# Patient Record
Sex: Male | Born: 1950
Health system: Southern US, Community
[De-identification: ages and names within clinical notes are randomized; demographics above are authoritative.]

## PROBLEM LIST (undated history)

## (undated) DIAGNOSIS — I10 Essential (primary) hypertension: Secondary | ICD-10-CM

## (undated) DIAGNOSIS — E119 Type 2 diabetes mellitus without complications: Secondary | ICD-10-CM

## (undated) DIAGNOSIS — M109 Gout, unspecified: Secondary | ICD-10-CM

## (undated) DIAGNOSIS — E785 Hyperlipidemia, unspecified: Secondary | ICD-10-CM

## (undated) DIAGNOSIS — C61 Malignant neoplasm of prostate: Secondary | ICD-10-CM

## (undated) HISTORY — PX: MULTIPLE TOOTH EXTRACTIONS: SHX2053

## (undated) HISTORY — PX: OTHER SURGICAL HISTORY: SHX169

## (undated) HISTORY — PX: PROSTATE BIOPSY: SHX241

---
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
## 2000-09-24 ENCOUNTER — Emergency Department (HOSPITAL_COMMUNITY): Admission: EM | Admit: 2000-09-24 | Discharge: 2000-09-24 | Payer: Self-pay | Admitting: Emergency Medicine

## 2001-02-21 ENCOUNTER — Emergency Department (HOSPITAL_COMMUNITY): Admission: EM | Admit: 2001-02-21 | Discharge: 2001-02-21 | Payer: Self-pay | Admitting: Emergency Medicine

## 2001-02-21 ENCOUNTER — Encounter: Payer: Self-pay | Admitting: Emergency Medicine

## 2001-12-05 ENCOUNTER — Emergency Department (HOSPITAL_COMMUNITY): Admission: EM | Admit: 2001-12-05 | Discharge: 2001-12-05 | Payer: Self-pay | Admitting: Emergency Medicine

## 2001-12-05 ENCOUNTER — Encounter: Payer: Self-pay | Admitting: Emergency Medicine

## 2004-09-18 ENCOUNTER — Emergency Department (HOSPITAL_COMMUNITY): Admission: EM | Admit: 2004-09-18 | Discharge: 2004-09-18 | Payer: Self-pay | Admitting: Emergency Medicine

## 2004-12-14 ENCOUNTER — Emergency Department (HOSPITAL_COMMUNITY): Admission: EM | Admit: 2004-12-14 | Discharge: 2004-12-14 | Payer: Self-pay | Admitting: Emergency Medicine

## 2005-06-16 ENCOUNTER — Emergency Department (HOSPITAL_COMMUNITY): Admission: EM | Admit: 2005-06-16 | Discharge: 2005-06-16 | Payer: Self-pay | Admitting: Emergency Medicine

## 2005-07-09 ENCOUNTER — Emergency Department (HOSPITAL_COMMUNITY): Admission: EM | Admit: 2005-07-09 | Discharge: 2005-07-09 | Payer: Self-pay | Admitting: Emergency Medicine

## 2006-07-26 ENCOUNTER — Emergency Department (HOSPITAL_COMMUNITY): Admission: EM | Admit: 2006-07-26 | Discharge: 2006-07-26 | Payer: Self-pay | Admitting: Emergency Medicine

## 2006-10-24 ENCOUNTER — Emergency Department (HOSPITAL_COMMUNITY): Admission: EM | Admit: 2006-10-24 | Discharge: 2006-10-24 | Payer: Self-pay | Admitting: Family Medicine

## 2008-11-02 ENCOUNTER — Emergency Department (HOSPITAL_COMMUNITY): Admission: EM | Admit: 2008-11-02 | Discharge: 2008-11-02 | Payer: Self-pay | Admitting: Emergency Medicine

## 2009-10-10 ENCOUNTER — Emergency Department (HOSPITAL_COMMUNITY): Admission: EM | Admit: 2009-10-10 | Discharge: 2009-10-10 | Payer: Self-pay | Admitting: Emergency Medicine

## 2009-10-20 ENCOUNTER — Emergency Department (HOSPITAL_COMMUNITY): Admission: EM | Admit: 2009-10-20 | Discharge: 2009-10-20 | Payer: Self-pay | Admitting: Emergency Medicine

## 2010-10-25 ENCOUNTER — Emergency Department (HOSPITAL_COMMUNITY)
Admission: EM | Admit: 2010-10-25 | Discharge: 2010-10-25 | Payer: Self-pay | Source: Home / Self Care | Admitting: Family Medicine

## 2015-04-26 ENCOUNTER — Emergency Department (HOSPITAL_COMMUNITY)
Admission: EM | Admit: 2015-04-26 | Discharge: 2015-04-26 | Disposition: A | Discharge status: Home / Self Care | Payer: BLUE CROSS/BLUE SHIELD | Attending: Emergency Medicine | Admitting: Emergency Medicine

## 2015-04-26 ENCOUNTER — Encounter (HOSPITAL_COMMUNITY): Payer: Self-pay | Admitting: Emergency Medicine

## 2015-04-26 DIAGNOSIS — I10 Essential (primary) hypertension: Secondary | ICD-10-CM | POA: Insufficient documentation

## 2015-04-26 DIAGNOSIS — M10061 Idiopathic gout, right knee: Secondary | ICD-10-CM | POA: Diagnosis not present

## 2015-04-26 DIAGNOSIS — M25561 Pain in right knee: Secondary | ICD-10-CM | POA: Diagnosis present

## 2015-04-26 DIAGNOSIS — M109 Gout, unspecified: Secondary | ICD-10-CM

## 2015-04-26 HISTORY — DX: Essential (primary) hypertension: I10

## 2015-04-26 HISTORY — DX: Gout, unspecified: M10.9

## 2015-04-26 MED ORDER — HYDROCODONE-ACETAMINOPHEN 5-325 MG PO TABS: 1.0000 | ORAL_TABLET | Freq: Four times a day (QID) | ORAL | Status: DC | PRN

## 2015-04-26 MED ORDER — INDOMETHACIN 25 MG PO CAPS: 25.0000 mg | ORAL_CAPSULE | Freq: Three times a day (TID) | ORAL | Status: DC | PRN

## 2015-04-26 NOTE — ED Provider Notes (Signed)
CSN: [id number]     Arrival date & time 04/26/15  4098 History   First MD Initiated Contact with Patient 04/26/15 1024     Chief Complaint  Patient presents with  . Knee Pain     (Consider location/radiation/quality/duration/timing/severity/associated sxs/prior Treatment) HPI Comments: Pt states that he has a history of gout and this feels similar. He does take allopurinol every day  Patient is a 64 y.o. male presenting with knee pain. The history is provided by the patient. No language interpreter was used.  Knee Pain Location:  Knee Injury: no   Knee location:  R knee Pain details:    Quality:  Aching   Radiates to:  Does not radiate   Severity:  Moderate   Onset quality:  Gradual   Timing:  Constant   Progression:  Unchanged Chronicity:  Recurrent Dislocation: no   Relieved by:  Nothing Worsened by:  Bearing weight Ineffective treatments:  None tried Associated symptoms: swelling   Associated symptoms: no decreased ROM and no muscle weakness     Past Medical History  Diagnosis Date  . Gout   . Hypertension    History reviewed. No pertinent past surgical history. Family History  Problem Relation Age of Onset  . Hypertension Mother   . Cancer Mother   . Heart failure Sister    History  Substance Use Topics  . Smoking status: Never Smoker   . Smokeless tobacco: Not on file  . Alcohol Use: Yes    Review of Systems  All other systems reviewed and are negative.     Allergies  Review of patient's allergies indicates not on file.  Home Medications   Prior to Admission medications   Medication Sig Start Date End Date Taking? Authorizing Provider  HYDROcodone-acetaminophen (NORCO/VICODIN) 5-325 MG per tablet Take 1-2 tablets by mouth every 6 (six) hours as needed for severe pain. 04/26/15   Glendell Docker, NP  indomethacin (INDOCIN) 25 MG capsule Take 1 capsule (25 mg total) by mouth 3 (three) times daily as needed. 04/26/15   Glendell Docker, NP   BP 161/95  mmHg  Pulse 74  Temp(Src) 98.7 F (37.1 C) (Oral)  Resp 18  Ht 5\' 7"  (1.702 m)  Wt 185 lb (83.915 kg)  BMI 28.97 kg/m2  SpO2 100% Physical Exam  Constitutional: He is oriented to person, place, and time. He appears well-developed and well-nourished.  Cardiovascular: Normal rate and regular rhythm.   Pulmonary/Chest: Effort normal and breath sounds normal.  Musculoskeletal:  Swelling and warmth noted to the right knee. Decrease rom. No redness noted  Neurological: He is alert and oriented to person, place, and time.  Skin: Skin is warm and dry.  Nursing note and vitals reviewed.   ED Course  Procedures (including critical care time) Labs Review Labs Reviewed - No data to display  Imaging Review No results found.   EKG Interpretation None      MDM   Final diagnoses:  Acute gout of right knee, unspecified cause    Considered septic joint although doubt. Pt given indomethacin and hydrocodone. Given return precautions   Glendell Docker, NP 04/26/15 [place], MD 04/26/15 [phone]

## 2015-04-26 NOTE — ED Notes (Addendum)
Pt reported having trouble bending rt knee. Pt denies injury/trauma. (+)PMS, CRT brisk and LROM. Noted swelling. No deformity/bruising noted. Pt reported having hx gout to lt knee.

## 2015-04-26 NOTE — Discharge Instructions (Signed)

## 2015-05-10 ENCOUNTER — Encounter (HOSPITAL_COMMUNITY): Payer: Self-pay | Admitting: *Deleted

## 2015-05-10 ENCOUNTER — Emergency Department (HOSPITAL_COMMUNITY)
Admission: EM | Admit: 2015-05-10 | Discharge: 2015-05-10 | Disposition: A | Discharge status: Home / Self Care | Payer: BLUE CROSS/BLUE SHIELD | Attending: Emergency Medicine | Admitting: Emergency Medicine

## 2015-05-10 DIAGNOSIS — I10 Essential (primary) hypertension: Secondary | ICD-10-CM | POA: Diagnosis not present

## 2015-05-10 DIAGNOSIS — M79641 Pain in right hand: Secondary | ICD-10-CM | POA: Diagnosis present

## 2015-05-10 DIAGNOSIS — M10031 Idiopathic gout, right wrist: Secondary | ICD-10-CM | POA: Diagnosis not present

## 2015-05-10 DIAGNOSIS — M109 Gout, unspecified: Secondary | ICD-10-CM

## 2015-05-10 MED ORDER — OXYCODONE-ACETAMINOPHEN 5-325 MG PO TABS
1.0000 | ORAL_TABLET | Freq: Once | ORAL | Status: AC
  Administered 2015-05-10: 1 via ORAL
  Filled 2015-05-10: qty 1

## 2015-05-10 MED ORDER — HYDROCODONE-ACETAMINOPHEN 5-325 MG PO TABS: 1.0000 | ORAL_TABLET | Freq: Four times a day (QID) | ORAL | Status: DC | PRN

## 2015-05-10 MED ORDER — INDOMETHACIN 25 MG PO CAPS: 25.0000 mg | ORAL_CAPSULE | Freq: Three times a day (TID) | ORAL | Status: DC | PRN

## 2015-05-10 NOTE — ED Notes (Signed)
Pt complains of pain and swelling in his right hand and wrist since 04/29/15. Pt denies injury. Pt states he has had gout in the past and had a similar problem in his right knee. Pt states ran out of gout medication.

## 2015-05-10 NOTE — Discharge Instructions (Signed)
Please follow-up with your PCP and inform them of your visit and all relevant information. Please monitor for new or worsening signs or symptoms, return immediately if new or worsening signs or symptoms present.

## 2015-05-10 NOTE — ED Provider Notes (Signed)
CSN: [id number]     Arrival date & time 05/10/15  1438 History  This chart was scribed for Lenn Sink, PA-C, working with Varney Biles, MD, by Starleen Arms, ED Scribe. This patient was seen in room WTR7/WTR7 and the patient's care was started at 3:25 PM.    Chief Complaint  Patient presents with  . Hand Pain   The history is provided by the patient. No language interpreter was used.   HPI Comments:  Jorge Decker is a 64 y.o. male with a history of gout who presents to the Emergency Department complaining of right hand pain and swelling onset 11 days ago without injury.  The pain is described as throbbing and can be worsened by the slightest touch. He reports a meat rich diet and increased etoh intake recently.  Patient reports a prior episode of gout in the right hand, but it typically manifests in his knees.  Patient recently began taking allopurinol daily after taking it intermittently for a period.  He has otherwise taken ibuprofen for pain.  Patient reports he recently had a gout flare in his right knee and was prescribed hydrocodone and  indocin with relief.  He denies history of STD.  He denies fever, nausea, vomiting, abdominal pain, penile discharge.     Past Medical History  Diagnosis Date  . Gout   . Hypertension    History reviewed. No pertinent past surgical history. Family History  Problem Relation Age of Onset  . Hypertension Mother   . Cancer Mother   . Heart failure Sister    History  Substance Use Topics  . Smoking status: Never Smoker   . Smokeless tobacco: Not on file  . Alcohol Use: Yes    Review of Systems  All other systems reviewed and are negative.   Allergies  Review of patient's allergies indicates not on file.  Home Medications   Prior to Admission medications   Medication Sig Start Date End Date Taking? Authorizing Provider  HYDROcodone-acetaminophen (NORCO/VICODIN) 5-325 MG per tablet Take 1 tablet by mouth every 6 (six) hours as needed.  05/10/15   Okey Regal, PA-C  indomethacin (INDOCIN) 25 MG capsule Take 1 capsule (25 mg total) by mouth 3 (three) times daily as needed. 05/10/15   Carlette Palmatier, PA-C   BP 142/95 mmHg  Pulse 79  Temp(Src) 98.6 F (37 C) (Oral)  Resp 18  SpO2 98%   Physical Exam  Constitutional: He is oriented to person, place, and time. He appears well-developed and well-nourished. No distress.  HENT:  Head: Normocephalic and atraumatic.  Eyes: Conjunctivae and EOM are normal.  Neck: Normal range of motion. Neck supple. No tracheal deviation present.  Cardiovascular: Normal rate.   Pulmonary/Chest: Effort normal. No respiratory distress.  Musculoskeletal: Normal range of motion.  Gouty tophi in the bilateral elbows.  Obvious swelling to the right wrist, warmth to touch.  No redness noted.  Distal sensation intact in affect extremity. Cap refill < 3 seconds in affected extremity.  Decreased ROM.   Neurological: He is alert and oriented to person, place, and time.  Skin: Skin is warm and dry.  Psychiatric: He has a normal mood and affect. His behavior is normal.  Nursing note and vitals reviewed.   ED Course  Procedures (including critical care time)  DIAGNOSTIC STUDIES: Oxygen Saturation is 98% on RA, normal by my interpretation.    COORDINATION OF CARE:  3:31 PM Discussed treatment plan with patient at bedside.  Patient acknowledges and agrees  with plan.    Labs Review Labs Reviewed - No data to display  Imaging Review No results found.   EKG Interpretation None      MDM   Final diagnoses:  Acute gout of right wrist, unspecified cause   . Labs:  Imaging:  Therapeutics:  Percocet  Assessment/Plan: Patient presents with likely gout to the right wrist. Wrist is warm,swollen, no signs of erythema. She has a history of gout has been eating more meats, drinking more alcohol recently, not taking his allopurinol as directed. Due to patient's significant past medical history and  similar presentation this is likely gout, highly unlikely to be septic arthritis at this time. Patient given a dose of narcotic pain medication here in the ED,prescribedindomethacin, Percocet for home. Patient isn't encouraged to follow up with his primary care provider in 3-5 days for reevaluation, he was given strict return precautions given worsening signs or symptoms present. He verbalizes understanding and agreement to today's plan and had no further questions concerns at time of discharge.  Discharge Meds: Indocin, Percocet   I personally performed the services described in this documentation, which was scribed in my presence. The recorded information has been reviewed and is accurate.   Okey Regal, PA-C 05/10/15 7062  Varney Biles, MD 05/11/15 [phone]

## 2016-06-08 ENCOUNTER — Encounter (HOSPITAL_COMMUNITY): Payer: Self-pay | Admitting: *Deleted

## 2016-06-08 ENCOUNTER — Inpatient Hospital Stay (HOSPITAL_COMMUNITY)
Admission: EM | Admit: 2016-06-08 | Discharge: 2016-06-10 | DRG: 638 | Disposition: A | Discharge status: Home Health | Payer: BLUE CROSS/BLUE SHIELD | Attending: Internal Medicine | Admitting: Internal Medicine

## 2016-06-08 DIAGNOSIS — Z8249 Family history of ischemic heart disease and other diseases of the circulatory system: Secondary | ICD-10-CM

## 2016-06-08 DIAGNOSIS — N179 Acute kidney failure, unspecified: Secondary | ICD-10-CM | POA: Diagnosis present

## 2016-06-08 DIAGNOSIS — Z79899 Other long term (current) drug therapy: Secondary | ICD-10-CM | POA: Diagnosis not present

## 2016-06-08 DIAGNOSIS — M109 Gout, unspecified: Secondary | ICD-10-CM | POA: Diagnosis present

## 2016-06-08 DIAGNOSIS — Z23 Encounter for immunization: Secondary | ICD-10-CM

## 2016-06-08 DIAGNOSIS — R748 Abnormal levels of other serum enzymes: Secondary | ICD-10-CM | POA: Diagnosis not present

## 2016-06-08 DIAGNOSIS — E1100 Type 2 diabetes mellitus with hyperosmolarity without nonketotic hyperglycemic-hyperosmolar coma (NKHHC): Secondary | ICD-10-CM | POA: Diagnosis not present

## 2016-06-08 DIAGNOSIS — I10 Essential (primary) hypertension: Secondary | ICD-10-CM | POA: Diagnosis present

## 2016-06-08 DIAGNOSIS — R7989 Other specified abnormal findings of blood chemistry: Secondary | ICD-10-CM | POA: Diagnosis present

## 2016-06-08 DIAGNOSIS — E1310 Other specified diabetes mellitus with ketoacidosis without coma: Secondary | ICD-10-CM | POA: Diagnosis not present

## 2016-06-08 DIAGNOSIS — E782 Mixed hyperlipidemia: Secondary | ICD-10-CM | POA: Diagnosis present

## 2016-06-08 DIAGNOSIS — E119 Type 2 diabetes mellitus without complications: Secondary | ICD-10-CM

## 2016-06-08 DIAGNOSIS — E1165 Type 2 diabetes mellitus with hyperglycemia: Secondary | ICD-10-CM | POA: Diagnosis present

## 2016-06-08 DIAGNOSIS — Z7984 Long term (current) use of oral hypoglycemic drugs: Secondary | ICD-10-CM | POA: Diagnosis not present

## 2016-06-08 DIAGNOSIS — E1110 Type 2 diabetes mellitus with ketoacidosis without coma: Secondary | ICD-10-CM

## 2016-06-08 DIAGNOSIS — E871 Hypo-osmolality and hyponatremia: Secondary | ICD-10-CM | POA: Diagnosis present

## 2016-06-08 DIAGNOSIS — E860 Dehydration: Secondary | ICD-10-CM | POA: Diagnosis present

## 2016-06-08 DIAGNOSIS — E876 Hypokalemia: Secondary | ICD-10-CM | POA: Diagnosis present

## 2016-06-08 LAB — URINALYSIS, ROUTINE W REFLEX MICROSCOPIC
Bilirubin Urine: NEGATIVE
Glucose, UA: >1000 mg/dL — AB
KETONES UR: 15 mg/dL — AB
Leukocytes, UA: NEGATIVE
NITRITE: NEGATIVE
Protein, ur: NEGATIVE mg/dL
SPECIFIC GRAVITY, URINE: 1.036 — AB (ref 1.005–1.030)
pH: 5.5 (ref 5.0–8.0)

## 2016-06-08 LAB — BASIC METABOLIC PANEL
Anion gap: 15 (ref 5–15)
BUN: 24 mg/dL — ABNORMAL HIGH (ref 6–20)
CALCIUM: 9.2 mg/dL (ref 8.9–10.3)
CO2: 22 mmol/L (ref 22–32)
Chloride: 95 mmol/L — ABNORMAL LOW (ref 101–111)
Creatinine, Ser: 1.37 mg/dL — ABNORMAL HIGH (ref 0.61–1.24)
GFR calc Af Amer: >60 mL/min (ref >60)
GFR, EST NON AFRICAN AMERICAN: 54 mL/min — AB (ref >60)
Glucose, Bld: 308 mg/dL — ABNORMAL HIGH (ref 65–99)
Potassium: 4.3 mmol/L (ref 3.5–5.1)
SODIUM: 132 mmol/L — AB (ref 135–145)

## 2016-06-08 LAB — CBG MONITORING, ED
GLUCOSE-CAPILLARY: 599 mg/dL — AB (ref 65–99)
GLUCOSE-CAPILLARY: 588 mg/dL — AB (ref 65–99)
GLUCOSE-CAPILLARY: 459 mg/dL — AB (ref 65–99)

## 2016-06-08 LAB — I-STAT ARTERIAL BLOOD GAS, ED
Acid-base deficit: 3 mmol/L — ABNORMAL HIGH (ref 0.0–2.0)
Bicarbonate: 22.4 mEq/L (ref 20.0–24.0)
O2 Saturation: 95 %
PCO2 ART: 39.5 mmHg (ref 35.0–45.0)
PH ART: 7.362 (ref 7.350–7.450)
TCO2: 24 mmol/L (ref 0–100)
pO2, Arterial: 79 mmHg — ABNORMAL LOW (ref 80.0–100.0)

## 2016-06-08 LAB — PHOSPHORUS: PHOSPHORUS: 1.8 mg/dL — AB (ref 2.5–4.6)

## 2016-06-08 LAB — GLUCOSE, CAPILLARY
Glucose-Capillary: 322 mg/dL — ABNORMAL HIGH (ref 65–99)
Glucose-Capillary: 281 mg/dL — ABNORMAL HIGH (ref 65–99)

## 2016-06-08 LAB — CBC
HCT: 36.4 % — ABNORMAL LOW (ref 39.0–52.0)
HEMOGLOBIN: 12.9 g/dL — AB (ref 13.0–17.0)
MCH: 32.7 pg (ref 26.0–34.0)
MCHC: 35.4 g/dL (ref 30.0–36.0)
MCV: 92.4 fL (ref 78.0–100.0)
PLATELETS: 146 10*3/uL — AB (ref 150–400)
RBC: 3.94 MIL/uL — ABNORMAL LOW (ref 4.22–5.81)
RDW: 11.9 % (ref 11.5–15.5)
WBC: 7.2 10*3/uL (ref 4.0–10.5)

## 2016-06-08 LAB — URINE MICROSCOPIC-ADD ON: Bacteria, UA: NONE SEEN

## 2016-06-08 LAB — BETA-HYDROXYBUTYRIC ACID: BETA-HYDROXYBUTYRIC ACID: 3.18 mmol/L — AB (ref 0.05–0.27)

## 2016-06-08 LAB — MAGNESIUM: Magnesium: 2.2 mg/dL (ref 1.7–2.4)

## 2016-06-08 MED ORDER — SODIUM CHLORIDE 0.9 % IV BOLUS (SEPSIS)
1000.0000 mL | Freq: Once | INTRAVENOUS | Status: AC
  Administered 2016-06-08: 1000 mL via INTRAVENOUS

## 2016-06-08 MED ORDER — SODIUM CHLORIDE 0.9 % IV SOLN
INTRAVENOUS | Status: DC
  Administered 2016-06-09: 08:00:00 via INTRAVENOUS

## 2016-06-08 MED ORDER — THIAMINE HCL 100 MG/ML IJ SOLN: 100.0000 mg | Freq: Every day | INTRAMUSCULAR | Status: DC

## 2016-06-08 MED ORDER — ASPIRIN EC 81 MG PO TBEC
81.0000 mg | DELAYED_RELEASE_TABLET | Freq: Every day | ORAL | Status: DC
  Administered 2016-06-09 – 2016-06-10 (×2): 81 mg via ORAL
  Filled 2016-06-08 (×2): qty 1

## 2016-06-08 MED ORDER — VITAMIN B-1 100 MG PO TABS
100.0000 mg | ORAL_TABLET | Freq: Every day | ORAL | Status: DC
  Administered 2016-06-09 – 2016-06-10 (×2): 100 mg via ORAL
  Filled 2016-06-08 (×2): qty 1

## 2016-06-08 MED ORDER — ATORVASTATIN CALCIUM 40 MG PO TABS
40.0000 mg | ORAL_TABLET | Freq: Every day | ORAL | Status: DC
  Administered 2016-06-09: 40 mg via ORAL
  Filled 2016-06-08: qty 1

## 2016-06-08 MED ORDER — ONDANSETRON HCL 4 MG PO TABS: 4.0000 mg | ORAL_TABLET | Freq: Four times a day (QID) | ORAL | Status: DC | PRN

## 2016-06-08 MED ORDER — FOLIC ACID 1 MG PO TABS
1.0000 mg | ORAL_TABLET | Freq: Every day | ORAL | Status: DC
  Administered 2016-06-09 – 2016-06-10 (×2): 1 mg via ORAL
  Filled 2016-06-08 (×2): qty 1

## 2016-06-08 MED ORDER — ACETAMINOPHEN 650 MG RE SUPP: 650.0000 mg | Freq: Four times a day (QID) | RECTAL | Status: DC | PRN

## 2016-06-08 MED ORDER — ACETAMINOPHEN 325 MG PO TABS: 650.0000 mg | ORAL_TABLET | Freq: Four times a day (QID) | ORAL | Status: DC | PRN

## 2016-06-08 MED ORDER — SODIUM CHLORIDE 0.9 % IV SOLN
INTRAVENOUS | Status: DC
  Administered 2016-06-08: 1000 mL via INTRAVENOUS

## 2016-06-08 MED ORDER — ENOXAPARIN SODIUM 30 MG/0.3ML ~~LOC~~ SOLN
30.0000 mg | SUBCUTANEOUS | Status: DC
  Administered 2016-06-09: 30 mg via SUBCUTANEOUS
  Filled 2016-06-08: qty 0.3

## 2016-06-08 MED ORDER — SODIUM CHLORIDE 0.9 % IV SOLN
INTRAVENOUS | Status: DC
  Administered 2016-06-08: 4 [IU]/h via INTRAVENOUS
  Filled 2016-06-08: qty 2.5

## 2016-06-08 MED ORDER — LORAZEPAM 1 MG PO TABS
1.0000 mg | ORAL_TABLET | Freq: Four times a day (QID) | ORAL | Status: DC | PRN
  Administered 2016-06-09: 1 mg via ORAL
  Filled 2016-06-08: qty 1

## 2016-06-08 MED ORDER — INSULIN REGULAR HUMAN 100 UNIT/ML IJ SOLN
INTRAMUSCULAR | Status: DC
  Administered 2016-06-08: 4.4 [IU]/h via INTRAVENOUS
  Administered 2016-06-08: 2.6 [IU]/h via INTRAVENOUS
  Administered 2016-06-09: 1 [IU]/h via INTRAVENOUS
  Administered 2016-06-09: 1.5 [IU]/h via INTRAVENOUS
  Administered 2016-06-09: 4.7 [IU]/h via INTRAVENOUS
  Administered 2016-06-09: 3.8 [IU]/h via INTRAVENOUS
  Administered 2016-06-09: 3.3 [IU]/h via INTRAVENOUS
  Administered 2016-06-09: 0.8 [IU]/h via INTRAVENOUS
  Administered 2016-06-09: 2.9 [IU]/h via INTRAVENOUS
  Filled 2016-06-08: qty 2.5

## 2016-06-08 MED ORDER — ADULT MULTIVITAMIN W/MINERALS CH
1.0000 | ORAL_TABLET | Freq: Every day | ORAL | Status: DC
  Administered 2016-06-09 – 2016-06-10 (×2): 1 via ORAL
  Filled 2016-06-08 (×2): qty 1

## 2016-06-08 MED ORDER — DEXTROSE-NACL 5-0.45 % IV SOLN
INTRAVENOUS | Status: DC
  Administered 2016-06-09: 125 mL/h via INTRAVENOUS

## 2016-06-08 MED ORDER — POTASSIUM CHLORIDE 10 MEQ/100ML IV SOLN
10.0000 meq | INTRAVENOUS | Status: AC
  Administered 2016-06-09 (×2): 10 meq via INTRAVENOUS
  Filled 2016-06-08 (×2): qty 100

## 2016-06-08 MED ORDER — DEXTROSE-NACL 5-0.45 % IV SOLN: INTRAVENOUS | Status: DC

## 2016-06-08 MED ORDER — ONDANSETRON HCL 4 MG/2ML IJ SOLN: 4.0000 mg | Freq: Four times a day (QID) | INTRAMUSCULAR | Status: DC | PRN

## 2016-06-08 MED ORDER — LORAZEPAM 2 MG/ML IJ SOLN: 1.0000 mg | Freq: Four times a day (QID) | INTRAMUSCULAR | Status: DC | PRN

## 2016-06-08 NOTE — ED Notes (Signed)
Attempted to call report

## 2016-06-08 NOTE — ED Triage Notes (Signed)
Pt went to PCP, had blood work done, glu showed 690, anion gap 21.5, A1C 14.6, Na 121. Pt reports increase thirst, urination. Labs drawn before 0900. Pt has not taken any insulin today.

## 2016-06-08 NOTE — H&P (Signed)
History and Physical  Patient Name: Jorge Decker     [id number]    DOB: Apr 21, 1951    DOA: 06/08/2016 PCP: Henrine Screws, MD   Patient coming from: Home  Chief Complaint: Polyuria and blurry vision  HPI: Jorge Decker is a 65 y.o. male with a past medical history significant for HTN and gout who presents with new onset diabetes.  The patient works at the [employer] where it has been extremely hot lately, and he has been drinking "lots" of sugary drinks to stay hydrated and eating "lots" of snacks to keep his energy up.  Over the last few weeks then, he has had polydipsia, polyuria, blurry vision and hunger.  Today he was seen at his PCP's office, diagnosed with new onset diabetes, and started on metformin.  A BMP was drawn at that visit, which returned in the afternoon showing AG >20 and glucose >600 mg/dL (and also HgbA1c 14%) and so he was called and told to go to the ER.  ED course: -Afebrile, heart rate 97, respirations, SpO2 and blood pressure normal  -Na 122 (only corrects to 130 with glucose), K 4.7, Cr 1.83 (baseline 0.9), WBC 7.2K, Hgb 12.9 -AG 19, bicarb was 19, but pH 7.36 -He was given 1L NS and TRH were asked to evaluate for admission  He has no family members with type 1 or 2 diabetes, and no family members with autoimmune disease that he knows of.  His BMI is ~26.  He has never been diagnosed with diabetes before and at his last physical at [facility] in October, his fasting glucose was 97 mg/dL.      ROS: Review of Systems  Constitutional: Negative for chills, diaphoresis, fever, malaise/fatigue and weight loss.  HENT: Negative.   Eyes: Positive for blurred vision.       Blurry vision, off an on for months  Respiratory: Negative for cough, sputum production, shortness of breath and wheezing.   Cardiovascular: Negative.   Gastrointestinal: Negative for abdominal pain, nausea and vomiting.  Genitourinary: Negative for dysuria, flank pain, frequency,  hematuria and urgency.  Musculoskeletal: Negative.   Skin: Negative.   Neurological: Negative.  Negative for weakness.  Endo/Heme/Allergies: Positive for polydipsia.  Psychiatric/Behavioral: Negative.       All other systems negative except as just noted or noted in the history of present illness.    Past Medical History:  Diagnosis Date  . Gout   . Hypertension     History reviewed. No pertinent surgical history.  Social History: Patient lives with his nephew.  The patient walks unassisted.  He works at the [employer].  He does not smoke.  He drinks about three beers plus a few shots of liquor daily, "a lot more" on weekends.  No history of withdrawal.  No illicit drug use.  No Known Allergies  Family history: family history includes Cancer in his mother; Heart failure in his sister; Hypertension in his mother.  Prior to Admission medications   Medication Sig Start Date End Date Taking? Authorizing Provider  HYDROcodone-acetaminophen (NORCO/VICODIN) 5-325 MG per tablet Take 1 tablet by mouth every 6 (six) hours as needed. 05/10/15   Okey Regal, PA-C  indomethacin (INDOCIN) 25 MG capsule Take 1 capsule (25 mg total) by mouth 3 (three) times daily as needed. 05/10/15   Okey Regal, PA-C       Physical Exam: BP 125/87   Pulse 93   Temp 98 F (36.7 C) (Oral)   Resp  20   Ht 5\' 6"  (1.676 m)   Wt 75.8 kg (167 lb 3.2 oz)   SpO2 94%   BMI 26.99 kg/m  General appearance: Well-developed, adult male, alert and in no acute distress.  Conversational.   Eyes: Anicteric, conjunctiva pink, lids and lashes normal.     ENT: No nasal deformity, discharge, or epistaxis.  OP moist without lesions.  Dentures in place on top, bridge on lower.  No thyromegaly. Lymph: No cervical or supraclavicular lymphadenopathy. Skin: Warm and dry.  No jaundice.  No suspicious rashes or lesions.  No acanthosis. Cardiac: Tachycardic, nl S1-S2, no murmurs appreciated.  Capillary refill is brisk.   JVP normal.  No LE edema.  Radial and DP pulses 2+ and symmetric. Respiratory: Normal respiratory rate and rhythm.  CTAB without rales or wheezes. GI: Abdomen soft without rigidity.  No TTP. No ascites, distension, hepatosplenomegaly.   MSK: No deformities or effusions.  No clubbing/cyanosis. Neuro: Cranial nerves normal. Sensorium intact and responding to questions, attention normal.  Speech is fluent.  Moves all extremities equally and with normal coordination.    Psych: Affect normal.  Judgment and insight appear normal.       Labs on Admission:  I have personally reviewed following labs and imaging studies: CBC:  Recent Labs Lab 06/08/16 1650  WBC 7.2  HGB 12.9*  HCT 36.4*  MCV 92.4  PLT 123456*   Basic Metabolic Panel:  Recent Labs Lab 06/08/16 1650  NA 122*  K 4.7  CL 84*  CO2 19*  GLUCOSE 592*  BUN 30*  CREATININE 1.83*  CALCIUM 9.6   GFR: Estimated Creatinine Clearance: 37.8 mL/min (by C-G formula based on SCr of 1.83 mg/dL).   HbA1C: 14% at clinic today  CBG:  Recent Labs Lab 06/08/16 1646 06/08/16 1831  GLUCAP 599* 588*       Assessment/Plan 1. New onset diabetes, unclear type, with hyperglycemia:  Not acidotic, but close, and with elevated anion gap.  Suspect a T1DM or LADA.  He follows with a PCP and his fasting sugar 10 months ago was normal, which is hard to rectify with type 2 diabetes, although this is possible.   -Insulin drip per protocol overnight -NPO -IVF -q4hrs BMP for now -Check BHOB -Supplement K -Check mag and phos -Will check GAD65/islet cell antibodies, for guidance on whether to start insulin at discharge -Consult to nutrtiion and diabetes education -Start baby aspirin and statin in diabetic -Check TSH and fasting lipids   2. HTN:  -Continue metoprolol -Previously on amlodipine-olmesartan, will restart ARB if BP remains elevated  3. Elevated creatinine:  Likely from polyuria. -Fluid resuscitation and trend BMP  4.  Hyponatremia:  Despite correction, still low. -Trend overnight      DVT prophylaxis: Lovenox  Code Status: FULL  Family Communication: None present  Disposition Plan: Anticipate IV insulin to correct glucose and close monitoring of electrolytes and renal function, consultation with diabetes education and nutrition.  Consults called: None Admission status: INPATIENT, telemetry     Medical decision making: Patient seen at 8:10 PM on 06/08/2016.  The patient was discussed with Will Dansie, PA-C. What exists of the patient's chart was reviewed in depth and outside records from his PCP's office were reviewed.  Clinical condition: stable but requiring repeat lab monitoring and hourly glucose checks overnight, with multiple laboratory abnormalities.          Edwin Dada [facility] Pager [phone]    At the time of admission, it appears  that the appropriate admission status for this patient is INPATIENT. This is judged to be reasonable and necessary in order to provide the required intensity of service to ensure the patient's safety given the presenting symptoms, physical exam findings, and initial radiographic and laboratory data in the context of their chronic comorbidities.  Together, these circumstances are felt to place her/him at high risk for further clinical deterioration threatening life, limb, or organ. The following factors support the admission status of inpatient:   A. The patient's presenting symptoms include polyuria, polydipsia, blurry vision B. The worrisome physical exam findings include tachycardia C. The initial radiographic and laboratory data are worrisome because of elevated creatinine, hyponatremia, hyperglycemia, low bicarbonate, elevated anion gap D. The chronic co-morbidities include hypertension E. Patient requires inpatient status due to high intensity of service, high risk for further deterioration and high frequency of surveillance  required. F. I certify that at the point of admission it is my clinical judgment that the patient will require inpatient hospital care spanning beyond 2 midnights from the point of admission.

## 2016-06-08 NOTE — ED Notes (Signed)
CBG: 588 RN notified

## 2016-06-08 NOTE — ED Notes (Addendum)
Critical Glucose 592 received from Lab. Primary RN, [place] notified.

## 2016-06-08 NOTE — ED Provider Notes (Signed)
[facility] DEPT Provider Note   CSN: [id number] Arrival date & time: 06/08/16  1623     History   Chief Complaint Chief Complaint  Patient presents with  . Hyperglycemia    HPI Jorge Decker is a 65 y.o. male.  Jorge Decker is a 65 y.o. Male who presents to the emergency department from his primary care doctor's office with new onset diabetes. The patient reports over the past several weeks he's had lots of increased thirst, hunger and increased urination. He reports he had blood work done at his primary care doctor's office today and was noted to have elevated blood sugars. His blood work showed a glucose of 690, and an anion gap of 21.5. He was directed to the emergency department. Patient has no history of diabetes. He denies any family history diabetes. He reports he only takes medication for hypertension. Patient denies fevers, chest pain, shortness of breath, abdominal pain, nausea, vomiting, rashes or dysuria.   The history is provided by the patient. No language interpreter was used.    Past Medical History:  Diagnosis Date  . Gout   . Hypertension     Patient Active Problem List   Diagnosis Date Noted  . Diabetes mellitus, new onset ([place]) 06/08/2016  . Essential hypertension 06/08/2016  . Elevated serum creatinine 06/08/2016  . Diabetes mellitus with hyperosmolarity without hyperglycemic hyperosmolar nonketotic coma ([place]) 06/08/2016    History reviewed. No pertinent surgical history.     Home Medications    Prior to Admission medications   Medication Sig Start Date End Date Taking? Authorizing Provider  allopurinol (ZYLOPRIM) 300 MG tablet Take 300 mg by mouth every morning. 05/21/16  Yes Historical Provider, MD  Cholecalciferol (VITAMIN D3) 1000 units CAPS Take 2,000 Units by mouth daily.   Yes Historical Provider, MD  metFORMIN (GLUCOPHAGE-XR) 500 MG 24 hr tablet Take 500 mg by mouth daily with breakfast.   Yes Historical Provider, MD  metoprolol  (LOPRESSOR) 50 MG tablet Take 50 mg by mouth 2 (two) times daily. 06/03/16  Yes Historical Provider, MD  naproxen sodium (ALEVE) 220 MG tablet Take 220-440 mg by mouth 2 (two) times daily as needed (for pain).   Yes Historical Provider, MD  probenecid (BENEMID) 500 MG tablet Take 500 mg by mouth daily.   Yes Historical Provider, MD  vitamin B-12 (CYANOCOBALAMIN) 500 MCG tablet Take 500 mcg by mouth daily.   Yes Historical Provider, MD  HYDROcodone-acetaminophen (NORCO/VICODIN) 5-325 MG per tablet Take 1 tablet by mouth every 6 (six) hours as needed. Patient not taking: Reported on 06/08/2016 05/10/15   Okey Regal, PA-C  indomethacin (INDOCIN) 25 MG capsule Take 1 capsule (25 mg total) by mouth 3 (three) times daily as needed. Patient not taking: Reported on 06/08/2016 05/10/15   Okey Regal, PA-C    Family History Family History  Problem Relation Age of Onset  . Hypertension Mother   . Cancer Mother   . Heart failure Sister   . Diabetes Neg Hx   . Autoimmune disease Neg Hx     Social History Social History  Substance Use Topics  . Smoking status: Never Smoker  . Smokeless tobacco: Never Used  . Alcohol use Yes     Comment: 5 per day     Allergies   Review of patient's allergies indicates no known allergies.   Review of Systems Review of Systems  Constitutional: Negative for chills and fever.  HENT: Negative for congestion and sore throat.   Eyes:  Negative for visual disturbance.  Respiratory: Negative for cough and shortness of breath.   Cardiovascular: Negative for chest pain.  Gastrointestinal: Negative for abdominal pain, nausea and vomiting.  Endocrine: Positive for polydipsia, polyphagia and polyuria.  Genitourinary: Positive for frequency. Negative for decreased urine volume, dysuria, hematuria and urgency.  Musculoskeletal: Negative for back pain and neck pain.  Skin: Negative for rash and wound.  Neurological: Negative for headaches.     Physical  Exam Updated Vital Signs BP 125/87   Pulse 93   Temp 98 F (36.7 C) (Oral)   Resp 20   Ht 5\' 6"  (1.676 m)   Wt 75.8 kg   SpO2 94%   BMI 26.99 kg/m   Physical Exam  Constitutional: He appears well-developed and well-nourished. No distress.  Nontoxic appearing.  HENT:  Head: Normocephalic and atraumatic.  Mouth/Throat: Oropharynx is clear and moist.  Mucous membranes are moist.  Eyes: Conjunctivae are normal. Pupils are equal, round, and reactive to light. Right eye exhibits no discharge. Left eye exhibits no discharge.  Neck: Neck supple. No JVD present.  Cardiovascular: Normal rate, regular rhythm, normal heart sounds and intact distal pulses.  Exam reveals no gallop and no friction rub.   No murmur heard. Pulmonary/Chest: Effort normal and breath sounds normal. No respiratory distress. He has no wheezes. He has no rales.  Abdominal: Soft. There is no tenderness. There is no guarding.  Musculoskeletal: He exhibits no edema.  Lymphadenopathy:    He has no cervical adenopathy.  Neurological: He is alert. Coordination normal.  Skin: Skin is warm and dry. Capillary refill takes less than 2 seconds. No rash noted. He is not diaphoretic. No erythema. No pallor.  Psychiatric: He has a normal mood and affect. His behavior is normal.  Nursing note and vitals reviewed.    ED Treatments / Results  Labs (all labs ordered are listed, but only abnormal results are displayed) Labs Reviewed  BASIC METABOLIC PANEL - Abnormal; Notable for the following:       Result Value   Sodium 122 (*)    Chloride 84 (*)    CO2 19 (*)    Glucose, Bld 592 (*)    BUN 30 (*)    Creatinine, Ser 1.83 (*)    GFR calc non Af Amer 38 (*)    GFR calc Af Amer 44 (*)    Anion gap 19 (*)    All other components within normal limits  CBC - Abnormal; Notable for the following:    RBC 3.94 (*)    Hemoglobin 12.9 (*)    HCT 36.4 (*)    Platelets 146 (*)    All other components within normal limits   URINALYSIS, ROUTINE W REFLEX MICROSCOPIC (NOT AT [facility]) - Abnormal; Notable for the following:    Specific Gravity, Urine 1.036 (*)    Glucose, UA >1000 (*)    Hgb urine dipstick TRACE (*)    Ketones, ur 15 (*)    All other components within normal limits  URINE MICROSCOPIC-ADD ON - Abnormal; Notable for the following:    Squamous Epithelial / LPF 0-5 (*)    All other components within normal limits  CBG MONITORING, ED - Abnormal; Notable for the following:    Glucose-Capillary 599 (*)    All other components within normal limits  CBG MONITORING, ED - Abnormal; Notable for the following:    Glucose-Capillary 588 (*)    All other components within normal limits  CBG MONITORING, ED -  Abnormal; Notable for the following:    Glucose-Capillary 459 (*)    All other components within normal limits  I-STAT ARTERIAL BLOOD GAS, ED - Abnormal; Notable for the following:    pO2, Arterial 79.0 (*)    Acid-base deficit 3.0 (*)    All other components within normal limits  BLOOD GAS, ARTERIAL  CBG MONITORING, ED    EKG  EKG Interpretation None       Radiology No results found.  Procedures Procedures (including critical care time)  Medications Ordered in ED Medications  dextrose 5 %-0.45 % sodium chloride infusion (not administered)  insulin regular (NOVOLIN R,HUMULIN R) 250 Units in sodium chloride 0.9 % 250 mL (1 Units/mL) infusion (not administered)  sodium chloride 0.9 % bolus 1,000 mL (1,000 mLs Intravenous New Bag/Given 06/08/16 2025)    And  0.9 %  sodium chloride infusion (not administered)  sodium chloride 0.9 % bolus 1,000 mL (0 mLs Intravenous Stopped 06/08/16 2012)     Initial Impression / Assessment and Plan / ED Course  I have reviewed the triage vital signs and the nursing notes.  Pertinent labs & imaging results that were available during my care of the patient were reviewed by me and considered in my medical decision making (see chart for details).  Clinical  Course   Patient presents with new onset diabetes. No history of diabetes or DKA. Patient presents with glucose of 592. Creatinine of 1.83. Anion gap of 19. Patient started on glucose stabilizer. Will admit for new onset diabetes and DKA. Patient in agreement with admission. Patient accepted for admission by Dr. Loleta Books.   Final Clinical Impressions(s) / ED Diagnoses   Final diagnoses:  Diabetic ketoacidosis without coma associated with type 2 diabetes mellitus [facility])    New Prescriptions New Prescriptions   No medications on file     Waynetta Pean, PA-C 06/08/16 2047    Charlesetta Shanks, MD 06/09/16 [phone]

## 2016-06-08 NOTE — ED Notes (Signed)
Next CBG due at 2145

## 2016-06-09 ENCOUNTER — Encounter (HOSPITAL_COMMUNITY): Payer: Self-pay | Admitting: *Deleted

## 2016-06-09 DIAGNOSIS — E876 Hypokalemia: Secondary | ICD-10-CM | POA: Diagnosis present

## 2016-06-09 DIAGNOSIS — I10 Essential (primary) hypertension: Secondary | ICD-10-CM

## 2016-06-09 DIAGNOSIS — N179 Acute kidney failure, unspecified: Secondary | ICD-10-CM | POA: Diagnosis present

## 2016-06-09 DIAGNOSIS — R748 Abnormal levels of other serum enzymes: Secondary | ICD-10-CM

## 2016-06-09 DIAGNOSIS — E119 Type 2 diabetes mellitus without complications: Secondary | ICD-10-CM

## 2016-06-09 DIAGNOSIS — E1100 Type 2 diabetes mellitus with hyperosmolarity without nonketotic hyperglycemic-hyperosmolar coma (NKHHC): Principal | ICD-10-CM

## 2016-06-09 LAB — GLUCOSE, CAPILLARY
GLUCOSE-CAPILLARY: 134 mg/dL — AB (ref 65–99)
GLUCOSE-CAPILLARY: 137 mg/dL — AB (ref 65–99)
GLUCOSE-CAPILLARY: 157 mg/dL — AB (ref 65–99)
GLUCOSE-CAPILLARY: 207 mg/dL — AB (ref 65–99)
GLUCOSE-CAPILLARY: 209 mg/dL — AB (ref 65–99)
Glucose-Capillary: 163 mg/dL — ABNORMAL HIGH (ref 65–99)
Glucose-Capillary: 170 mg/dL — ABNORMAL HIGH (ref 65–99)
Glucose-Capillary: 179 mg/dL — ABNORMAL HIGH (ref 65–99)
Glucose-Capillary: 186 mg/dL — ABNORMAL HIGH (ref 65–99)
Glucose-Capillary: 193 mg/dL — ABNORMAL HIGH (ref 65–99)
Glucose-Capillary: 216 mg/dL — ABNORMAL HIGH (ref 65–99)

## 2016-06-09 LAB — BASIC METABOLIC PANEL
ANION GAP: 19 — AB (ref 5–15)
ANION GAP: 6 (ref 5–15)
ANION GAP: 4 — AB (ref 5–15)
BUN: 30 mg/dL — ABNORMAL HIGH (ref 6–20)
BUN: 17 mg/dL (ref 6–20)
BUN: 15 mg/dL (ref 6–20)
CALCIUM: 8 mg/dL — AB (ref 8.9–10.3)
CALCIUM: 8 mg/dL — AB (ref 8.9–10.3)
CO2: 19 mmol/L — ABNORMAL LOW (ref 22–32)
CO2: 26 mmol/L (ref 22–32)
CO2: 25 mmol/L (ref 22–32)
CREATININE: 1.83 mg/dL — AB (ref 0.61–1.24)
Calcium: 9.6 mg/dL (ref 8.9–10.3)
Chloride: 84 mmol/L — ABNORMAL LOW (ref 101–111)
Chloride: 104 mmol/L (ref 101–111)
Chloride: 105 mmol/L (ref 101–111)
Creatinine, Ser: 0.95 mg/dL (ref 0.61–1.24)
Creatinine, Ser: 0.93 mg/dL (ref 0.61–1.24)
GFR calc Af Amer: 44 mL/min — ABNORMAL LOW (ref >60)
GFR, EST NON AFRICAN AMERICAN: 38 mL/min — AB (ref >60)
GLUCOSE: 137 mg/dL — AB (ref 65–99)
GLUCOSE: 186 mg/dL — AB (ref 65–99)
Glucose, Bld: 592 mg/dL (ref 65–99)
POTASSIUM: 4.1 mmol/L (ref 3.5–5.1)
POTASSIUM: 3.4 mmol/L — AB (ref 3.5–5.1)
Potassium: 4.7 mmol/L (ref 3.5–5.1)
Sodium: 122 mmol/L — ABNORMAL LOW (ref 135–145)
Sodium: 136 mmol/L (ref 135–145)
Sodium: 134 mmol/L — ABNORMAL LOW (ref 135–145)

## 2016-06-09 LAB — LIPID PANEL
CHOLESTEROL: 308 mg/dL — AB (ref 0–200)
LDL CALC: UNDETERMINED mg/dL (ref 0–99)
TRIGLYCERIDES: 1422 mg/dL — AB (ref <150)
VLDL: UNDETERMINED mg/dL (ref 0–40)

## 2016-06-09 LAB — TRIGLYCERIDES: Triglycerides: 1234 mg/dL — ABNORMAL HIGH (ref <150)

## 2016-06-09 LAB — TSH: TSH: 1.396 u[IU]/mL (ref 0.350–4.500)

## 2016-06-09 MED ORDER — INSULIN ASPART 100 UNIT/ML ~~LOC~~ SOLN
3.0000 [IU] | Freq: Three times a day (TID) | SUBCUTANEOUS | Status: DC
  Administered 2016-06-09 – 2016-06-10 (×5): 3 [IU] via SUBCUTANEOUS

## 2016-06-09 MED ORDER — PNEUMOCOCCAL VAC POLYVALENT 25 MCG/0.5ML IJ INJ
0.5000 mL | INJECTION | INTRAMUSCULAR | Status: AC
Start: 2016-06-10 — End: 2016-06-10
  Administered 2016-06-10: 0.5 mL via INTRAMUSCULAR
  Filled 2016-06-09: qty 0.5

## 2016-06-09 MED ORDER — LIVING WELL WITH DIABETES BOOK
Freq: Once | Status: AC
  Administered 2016-06-09: 17:00:00
  Filled 2016-06-09: qty 1

## 2016-06-09 MED ORDER — POTASSIUM CHLORIDE CRYS ER 20 MEQ PO TBCR
40.0000 meq | EXTENDED_RELEASE_TABLET | Freq: Four times a day (QID) | ORAL | Status: AC
  Administered 2016-06-09 (×2): 40 meq via ORAL
  Filled 2016-06-09 (×2): qty 2

## 2016-06-09 MED ORDER — FENOFIBRATE 54 MG PO TABS
54.0000 mg | ORAL_TABLET | Freq: Every day | ORAL | Status: DC
  Administered 2016-06-09 – 2016-06-10 (×2): 54 mg via ORAL
  Filled 2016-06-09 (×2): qty 1

## 2016-06-09 MED ORDER — ENOXAPARIN SODIUM 40 MG/0.4ML ~~LOC~~ SOLN
40.0000 mg | SUBCUTANEOUS | Status: DC
  Administered 2016-06-10: 40 mg via SUBCUTANEOUS
  Filled 2016-06-09: qty 0.4

## 2016-06-09 MED ORDER — INSULIN GLARGINE 100 UNIT/ML ~~LOC~~ SOLN
10.0000 [IU] | Freq: Every day | SUBCUTANEOUS | Status: DC
  Administered 2016-06-09: 10 [IU] via SUBCUTANEOUS
  Filled 2016-06-09 (×2): qty 0.1

## 2016-06-09 MED ORDER — INSULIN ASPART 100 UNIT/ML ~~LOC~~ SOLN
0.0000 [IU] | Freq: Three times a day (TID) | SUBCUTANEOUS | Status: DC
  Administered 2016-06-09: 3 [IU] via SUBCUTANEOUS
  Administered 2016-06-09 (×2): 2 [IU] via SUBCUTANEOUS
  Administered 2016-06-10: 3 [IU] via SUBCUTANEOUS
  Administered 2016-06-10: 5 [IU] via SUBCUTANEOUS

## 2016-06-09 NOTE — Plan of Care (Signed)
Problem: Food- and Nutrition-Related Knowledge Deficit (NB-1.1) Goal: Nutrition education Formal process to instruct or train a patient/client in a skill or to impart knowledge to help patients/clients voluntarily manage or modify food choices and eating behavior to maintain or improve health. Outcome: Completed/Met Date Met: 06/09/16  RD consulted for nutrition education regarding diabetes.   RD provided "Carbohydrate Counting for People with Diabetes" handout from the Academy of Nutrition and Dietetics. Family at bedside. Pt reports snacking on a lot of cakes and cookies. Discussed different food groups and their effects on blood sugar, emphasizing carbohydrate-containing foods. Provided list of carbohydrates and recommended serving sizes of common foods.  Discussed importance of controlled and consistent carbohydrate intake throughout the day. Provided examples of ways to balance meals/snacks and encouraged intake of high-fiber, whole grain complex carbohydrates. Pt reports he has been consuming large amount of sugar sweetened beverages. Discussed diabetic friendly drink options and artifical sweeteners. Teach back method used.  Expect good compliance.  Body mass index is 26.69 kg/m. Pt meets criteria for overweight based on current BMI.  Current diet order is carbohydrate modified. No percent meal completion recorded, however pt reports eating well with no other difficulties. Labs and medications reviewed. No further nutrition interventions warranted at this time. RD contact information provided. If additional nutrition issues arise, please re-consult RD.  Corrin Parker, MS, RD, LDN Pager # [phone] After hours/ weekend pager # [phone]

## 2016-06-09 NOTE — Progress Notes (Signed)
PROGRESS NOTE  Jorge Decker  [id number] DOB: 10-Jun-1951 DOA: 06/08/2016 PCP: Henrine Screws, MD Outpatient Specialists:  Subjective: Feels much better today, denies any complaints.  Brief Narrative:  Jorge Decker is a 65 y.o. male with a past medical history significant for HTN and gout who presents with new onset diabetes.  The patient works at the [employer] where it has been extremely hot lately, and he has been drinking "lots" of sugary drinks to stay hydrated and eating "lots" of snacks to keep his energy up.  Over the last few weeks then, he has had polydipsia, polyuria, blurry vision and hunger.  Today he was seen at his PCP's office, diagnosed with new onset diabetes, and started on metformin.  A BMP was drawn at that visit, which returned in the afternoon showing AG >20 and glucose >600 mg/dL (and also HgbA1c 14%) and so he was called and told to go to the ER.  Assessment & Plan:   Principal Problem:   Diabetes mellitus, new onset ([place]) Active Problems:   Essential hypertension   Elevated serum creatinine   Diabetes mellitus with hyperosmolarity without hyperglycemic hyperosmolar nonketotic coma ([place])   New onset diabetes, likely type II with hyperglycemia:  -Presented with marked hyperglycemia with glucose of 592, very mild acidosis with bicarbonate of 19. -Treated with intensive IV insulin therapy, taken off of the insulin drip this morning. -Started on cart modified diet. -Hemoglobin A1c reportedly is 14%, he will require insulin.  Hypertriglyceridemia -Likely mixed dyslipidemia secondary to uncontrolled diabetes, total cholesterol is 308 and triglyceride is 1422. -We'll start on TriCor, this is likely to improve once the glycemic control improves.  HTN:  -Continue metoprolol -Previously on amlodipine-olmesartan, will restart ARB if BP remains elevated  Elevated creatinine:  Likely from polyuria. -Fluid resuscitation and trend  BMP  Hyponatremia:  Despite correction, still low. -Trend overnight   DVT prophylaxis:  Code Status: Full Code Family Communication:  Disposition Plan:  Diet: Diet Carb Modified Fluid consistency: Thin; Room service appropriate? Yes  Consultants:   None  Procedures:   None  Antimicrobials:   None   Objective: Vitals:   06/08/16 2045 06/08/16 2206 06/09/16 0045 06/09/16 0622  BP: 146/100 (!) 138/95 120/81 108/76  Pulse: 86 84 69 67  Resp:  16 16 16   Temp:  99.2 F (37.3 C) 98 F (36.7 C) 97.8 F (36.6 C)  TempSrc:  Oral Oral Oral  SpO2: 97% 100% 99% 98%  Weight:  75 kg (165 lb 5.5 oz)    Height:  5\' 6"  (1.676 m)      Intake/Output Summary (Last 24 hours) at 06/09/16 1256 Last data filed at 06/09/16 1218  Gross per 24 hour  Intake                0 ml  Output              725 ml  Net             -725 ml   Filed Weights   06/08/16 1640 06/08/16 2206  Weight: 75.8 kg (167 lb 3.2 oz) 75 kg (165 lb 5.5 oz)    Examination: General exam: Appears calm and comfortable  Respiratory system: Clear to auscultation. Respiratory effort normal. Cardiovascular system: S1 & S2 heard, RRR. No JVD, murmurs, rubs, gallops or clicks. No pedal edema. Gastrointestinal system: Abdomen is nondistended, soft and nontender. No organomegaly or masses felt. Normal bowel sounds heard. Central nervous system: Alert and  oriented. No focal neurological deficits. Extremities: Symmetric 5 x 5 power. Skin: No rashes, lesions or ulcers Psychiatry: Judgement and insight appear normal. Mood & affect appropriate.   Data Reviewed: I have personally reviewed following labs and imaging studies  CBC:  Recent Labs Lab 06/08/16 1650  WBC 7.2  HGB 12.9*  HCT 36.4*  MCV 92.4  PLT 123456*   Basic Metabolic Panel:  Recent Labs Lab 06/08/16 1650 06/08/16 2244 06/09/16 0328 06/09/16 0654  NA 122* 132* 136 134*  K 4.7 4.3 4.1 3.4*  CL 84* 95* 104 105  CO2 19* 22 26 25   GLUCOSE 592* 308*  137* 186*  BUN 30* 24* 17 15  CREATININE 1.83* 1.37* 0.95 0.93  CALCIUM 9.6 9.2 8.0* 8.0*  MG  --  2.2  --   --   PHOS  --  1.8*  --   --    GFR: Estimated Creatinine Clearance: 74.3 mL/min (by C-G formula based on SCr of 0.93 mg/dL). Liver Function Tests: No results for input(s): AST, ALT, ALKPHOS, BILITOT, PROT, ALBUMIN in the last 168 hours. No results for input(s): LIPASE, AMYLASE in the last 168 hours. No results for input(s): AMMONIA in the last 168 hours. Coagulation Profile: No results for input(s): INR, PROTIME in the last 168 hours. Cardiac Enzymes: No results for input(s): CKTOTAL, CKMB, CKMBINDEX, TROPONINI in the last 168 hours. BNP (last 3 results) No results for input(s): PROBNP in the last 8760 hours. HbA1C: No results for input(s): HGBA1C in the last 72 hours. CBG:  Recent Labs Lab 06/09/16 0457 06/09/16 0557 06/09/16 0655 06/09/16 0751 06/09/16 1148  GLUCAP 157* 207* 186* 163* 209*   Lipid Profile:  Recent Labs  06/09/16 0328 06/09/16 0654  CHOL 308*  --   HDL NOT REPORTED DUE TO HIGH TRIGLYCERIDES  --   LDLCALC UNABLE TO CALCULATE IF TRIGLYCERIDE OVER 400 mg/dL  --   TRIG 1,422* 1,234*  CHOLHDL NOT REPORTED DUE TO HIGH TRIGLYCERIDES  --    Thyroid Function Tests:  Recent Labs  06/08/16 2243  TSH 1.396   Anemia Panel: No results for input(s): VITAMINB12, FOLATE, FERRITIN, TIBC, IRON, RETICCTPCT in the last 72 hours. Urine analysis:    Component Value Date/Time   COLORURINE YELLOW 06/08/2016 [place] 06/08/2016 1707   LABSPEC 1.036 (H) 06/08/2016 1707   PHURINE 5.5 06/08/2016 1707   GLUCOSEU >1000 (A) 06/08/2016 1707   HGBUR TRACE (A) 06/08/2016 1707   BILIRUBINUR NEGATIVE 06/08/2016 1707   KETONESUR 15 (A) 06/08/2016 1707   PROTEINUR NEGATIVE 06/08/2016 1707   NITRITE NEGATIVE 06/08/2016 1707   LEUKOCYTESUR NEGATIVE 06/08/2016 1707   Sepsis Labs: @LABRCNTIP (procalcitonin:4,lacticidven:4)  )No results found for  this or any previous visit (from the past 240 hour(s)).   Invalid input(s): PROCALCITONIN, [place]   Radiology Studies: No results found.      Scheduled Meds: . aspirin EC  81 mg Oral Daily  . atorvastatin  40 mg Oral q1800  . enoxaparin (LOVENOX) injection  30 mg Subcutaneous Q24H  . folic acid  1 mg Oral Daily  . insulin aspart  0-9 Units Subcutaneous TID WC  . insulin aspart  3 Units Subcutaneous TID WC  . insulin glargine  10 Units Subcutaneous QHS  . living well with diabetes book   Does not apply Once  . multivitamin with minerals  1 tablet Oral Daily  . [START ON 06/10/2016] pneumococcal 23 valent vaccine  0.5 mL Intramuscular Tomorrow-1000  . thiamine  100 mg  Oral Daily   Or  . thiamine  100 mg Intravenous Daily   Continuous Infusions: . sodium chloride 125 mL/hr at 06/09/16 0805     LOS: 1 day    Time spent: 35 minutes    Jaleena Viviani A, MD [facility] Pager [phone]  If 7PM-7AM, please contact night-coverage [email or web address] Password [facility] 06/09/2016, 12:56 PM

## 2016-06-09 NOTE — Progress Notes (Signed)
Spoke with patient about the new onset of diabetes. States that he has never been diagnosed with pre-diabetes and does not have any history of DM in his family. Patient very interested in control and seems willing to take insulin if needed. States that he does get breaks at his work. He gets much exercise walking at his work at [employer] for [employer]. Covered general diabetes care, checking blood sugars at home; dietician has seen patient. Would benefit from outpatient education at [facility] and can be followed by nurse at [facility]. Patient states that he has given himself an  insulin injection and will be instructed on checking his own blood sugars by staff RN.  Will continue to monitor blood sugars while in the hospital. Harvel Ricks RN BSN CDE

## 2016-06-09 NOTE — Progress Notes (Signed)
Patient and patients wife have been educated by RN how to read a insulin sliding scale to determine how much insulin patient needs. Patient has demonstrated in front of RN how to properly draw up insulin and self inject into abdomen.

## 2016-06-10 DIAGNOSIS — N179 Acute kidney failure, unspecified: Secondary | ICD-10-CM

## 2016-06-10 DIAGNOSIS — E876 Hypokalemia: Secondary | ICD-10-CM

## 2016-06-10 LAB — ANTI-ISLET CELL ANTIBODY: PANCREATIC ISLET CELL ANTIBODY: NEGATIVE

## 2016-06-10 LAB — BASIC METABOLIC PANEL
ANION GAP: 8 (ref 5–15)
BUN: 7 mg/dL (ref 6–20)
CHLORIDE: 102 mmol/L (ref 101–111)
CO2: 24 mmol/L (ref 22–32)
CREATININE: 0.91 mg/dL (ref 0.61–1.24)
Calcium: 9 mg/dL (ref 8.9–10.3)
GFR calc non Af Amer: >60 mL/min (ref >60)
Glucose, Bld: 229 mg/dL — ABNORMAL HIGH (ref 65–99)
POTASSIUM: 4.7 mmol/L (ref 3.5–5.1)
SODIUM: 134 mmol/L — AB (ref 135–145)

## 2016-06-10 LAB — GLUCOSE, CAPILLARY
Glucose-Capillary: 209 mg/dL — ABNORMAL HIGH (ref 65–99)
Glucose-Capillary: 280 mg/dL — ABNORMAL HIGH (ref 65–99)

## 2016-06-10 LAB — HEMOGLOBIN A1C
Hgb A1c MFr Bld: >15.5 % — ABNORMAL HIGH (ref 4.8–5.6)
Mean Plasma Glucose: >398 mg/dL

## 2016-06-10 MED ORDER — INSULIN GLARGINE 100 UNIT/ML SOLOSTAR PEN: 12.0000 [IU] | PEN_INJECTOR | Freq: Every day | SUBCUTANEOUS | 11 refills | Status: DC

## 2016-06-10 MED ORDER — BLOOD GLUCOSE MONITOR KIT: PACK | 0 refills | Status: AC

## 2016-06-10 MED ORDER — METFORMIN HCL 1000 MG PO TABS: 1000.0000 mg | ORAL_TABLET | Freq: Two times a day (BID) | ORAL | 0 refills | Status: DC

## 2016-06-10 MED ORDER — INSULIN STARTER KIT- PEN NEEDLES (ENGLISH)
1.0000 | Freq: Once | Status: AC
  Administered 2016-06-10: 1
  Filled 2016-06-10: qty 1

## 2016-06-10 MED ORDER — LISINOPRIL 2.5 MG PO TABS: 2.5000 mg | ORAL_TABLET | Freq: Every day | ORAL | 0 refills | Status: DC

## 2016-06-10 NOTE — Progress Notes (Signed)
[place] to be D/C'd Home per MD order.  Discussed prescriptions and follow up appointments with the patient. Prescriptions given to patient, medication list explained in detail. Rn educated pt on diet, CBG checks,demonstrated insulin pen and assisted pt in watcing diabetes education videos. RN answered all the pt's questions. Pt verbalized understanding.    Medication List    STOP taking these medications   ALEVE 220 MG tablet Generic drug:  naproxen sodium   HYDROcodone-acetaminophen 5-325 MG tablet Commonly known as:  NORCO/VICODIN   indomethacin 25 MG capsule Commonly known as:  INDOCIN   metFORMIN 500 MG 24 hr tablet Commonly known as:  GLUCOPHAGE-XR Replaced by:  metFORMIN 1000 MG tablet     TAKE these medications   allopurinol 300 MG tablet Commonly known as:  ZYLOPRIM Take 300 mg by mouth every morning.   blood glucose meter kit and supplies Kit Dispense based on patient and insurance preference. Use up to four times daily as directed. (FOR ICD-9 250.00, 250.01).   Insulin Glargine 100 UNIT/ML Solostar Pen Commonly known as:  LANTUS SOLOSTAR Inject 12 Units into the skin daily at 10 pm.   lisinopril 2.5 MG tablet Commonly known as:  ZESTRIL Take 1 tablet (2.5 mg total) by mouth daily.   metFORMIN 1000 MG tablet Commonly known as:  GLUCOPHAGE Take 1 tablet (1,000 mg total) by mouth 2 (two) times daily with a meal. Replaces:  metFORMIN 500 MG 24 hr tablet   metoprolol 50 MG tablet Commonly known as:  LOPRESSOR Take 50 mg by mouth 2 (two) times daily.   probenecid 500 MG tablet Commonly known as:  BENEMID Take 500 mg by mouth daily.   vitamin B-12 500 MCG tablet Commonly known as:  CYANOCOBALAMIN Take 500 mcg by mouth daily.   Vitamin D3 1000 units Caps Take 2,000 Units by mouth daily.       Vitals:   06/10/16 0747 06/10/16 1231  BP: (!) 146/98 (!) 151/100  Pulse: 78 93  Resp: 17 16  Temp: 98.4 F (36.9 C) 98.2 F (36.8 C)    Skin  clean, dry and intact without evidence of skin break down, no evidence of skin tears noted. IV catheter discontinued intact. Site without signs and symptoms of complications. Dressing and pressure applied. Pt denies pain at this time. No complaints noted.  An After Visit Summary was printed and given to the patient. Patient escorted via [place], and D/C home via private auto.  Carole Civil RN [facility] Phone [phone]

## 2016-06-10 NOTE — Care Management (Signed)
Request from pt RN to find out pt if insurance will cover insulin pen. We are unable to get this information until we have exact prescriptions.  Our experience is that pt's insurance BCBS generally covers the pens, however to be absolutely sure we would need the exact prescription it does not have to be written just call or text CM.  Please call CM for questions.  Thank you, Jasmine Pang RN MPH, case manager, [phone]

## 2016-06-10 NOTE — Care Management Note (Signed)
Case Management Note  Patient Details  Name: Jorge Decker MRN: [id number] Date of Birth: 1951-06-30  Subjective/Objective:       CM following for progression and d/c planning.              Action/Plan: 06/10/2016 Met with pt and family, pt copay for Lantus Pen is $45 for one month and $112 for 90 day supply. Pt informed and will also check on cost for vial of insulin with his pharmacy. Will follow for d/c needs. May benefit from short term HHRN to assist diabetic education.   Expected Discharge Date:  06/11/16               Expected Discharge Plan:  [place]  In-House Referral:  NA  Discharge planning Services  CM Consult  Post Acute Care Choice:  Home Health Choice offered to:     DME Arranged:    DME Agency:     HH Arranged:  RN [place] Agency:     Status of Service:  In process, will continue to follow  If discussed at Long Length of Stay Meetings, dates discussed:    Additional Comments:  Adron Bene, RN 06/10/2016, 1:41 PM

## 2016-06-10 NOTE — Discharge Summary (Signed)
Physician Discharge Summary  Jorge Decker WHQ:[id number] DOB: 1950-11-25 DOA: 06/08/2016  PCP: Henrine Screws, MD  Admit date: 06/08/2016 Discharge date: 06/10/2016  Admitted From: Home Disposition: Home  Recommendations for Outpatient Follow-up:  1. Follow up with PCP in 1-2 weeks 2. Please obtain BMP/CBC in one week, check fasting lipid profile in 4-6 weeks. 3. Check blood sugar at least 2 times a day and take the log to your primary care physician  Home Health: N/A Equipment/Devices: N/A  Discharge Condition: Stable CODE STATUS: Full code Diet recommendation: Carb Modified  Brief/Interim Summary: Jorge Decker is a 65 y.o. male with a past medical history significant for HTN and gout who presents with new onset diabetes.  The patient works at the [employer] where it has been extremely hot lately, and he has been drinking "lots" of sugary drinks to stay hydrated and eating "lots" of snacks to keep his energy up.  Over the last few weeks then, he has had polydipsia, polyuria, blurry vision and hunger.  Today he was seen at his PCP's office, diagnosed with new onset diabetes, and started on metformin.  A BMP was drawn at that visit, which returned in the afternoon showing AG >20 and glucose >600 mg/dL (and also HgbA1c 14%) and so he was called and told to go to the ER.  Discharge Diagnoses:  Principal Problem:   Diabetes mellitus, new onset ([place]) Active Problems:   Essential hypertension   Elevated serum creatinine   Diabetes mellitus with hyperosmolarity without hyperglycemic hyperosmolar nonketotic coma (HCC)   Hypokalemia   Acute kidney injury ([place])    New onset diabetes, likely type II with hyperglycemia: -Presented with marked hyperglycemia with glucose of 592, very mild acidosis with bicarbonate of 19. -Treated with intensive IV insulin therapy, taken off of the insulin drip this morning. -This is likely mild hyperglycemic hyperosmolar nonketotic state,  resolved. -Started on cart modified diet. -Hemoglobin A1c reportedly is 14%. -Discharged on Lantus 12 units at night with metformin 1000 mg twice a day.  Hypertriglyceridemia -Likely mixed dyslipidemia secondary to uncontrolled diabetes, total cholesterol is 308 and triglyceride is 1422. -This is likely to improve once the glycemic control improves. -Check lipid panel in 4-6 weeks  HTN: -Continue metoprolol -Is started on lisinopril 2.5 mg on discharge, blood pressure is reasonable.  Acute kidney injury -Presented with creatinine of 1.83, this is treated aggressively with IV fluids. -This is resolved.  Hyponatremia: -Presented with sodium of 22, likely due to combination of pseudohyponatremia and dehydration. -Sodium is 134 on discharge.  Discharge Instructions  Discharge Instructions    Ambulatory referral to Nutrition and Diabetic Education    Complete by:  As directed   New onset diabetes.   Diet - low sodium heart healthy    Complete by:  As directed   Increase activity slowly    Complete by:  As directed       Medication List    STOP taking these medications   ALEVE 220 MG tablet Generic drug:  naproxen sodium   HYDROcodone-acetaminophen 5-325 MG tablet Commonly known as:  NORCO/VICODIN   indomethacin 25 MG capsule Commonly known as:  INDOCIN   metFORMIN 500 MG 24 hr tablet Commonly known as:  GLUCOPHAGE-XR Replaced by:  metFORMIN 1000 MG tablet     TAKE these medications   allopurinol 300 MG tablet Commonly known as:  ZYLOPRIM Take 300 mg by mouth every morning.   blood glucose meter kit and supplies Kit Dispense based on  patient and insurance preference. Use up to four times daily as directed. (FOR ICD-9 250.00, 250.01).   Insulin Glargine 100 UNIT/ML Solostar Pen Commonly known as:  LANTUS SOLOSTAR Inject 12 Units into the skin daily at 10 pm.   lisinopril 2.5 MG tablet Commonly known as:  ZESTRIL Take 1 tablet (2.5 mg total) by mouth  daily.   metFORMIN 1000 MG tablet Commonly known as:  GLUCOPHAGE Take 1 tablet (1,000 mg total) by mouth 2 (two) times daily with a meal. Replaces:  metFORMIN 500 MG 24 hr tablet   metoprolol 50 MG tablet Commonly known as:  LOPRESSOR Take 50 mg by mouth 2 (two) times daily.   probenecid 500 MG tablet Commonly known as:  BENEMID Take 500 mg by mouth daily.   vitamin B-12 500 MCG tablet Commonly known as:  CYANOCOBALAMIN Take 500 mcg by mouth daily.   Vitamin D3 1000 units Caps Take 2,000 Units by mouth daily.       No Known Allergies  Consultations:  None   Procedures/Studies:  No results found. (Echo, Carotid, EGD, Colonoscopy, ERCP)    Subjective:   Discharge Exam: Vitals:   06/10/16 0612 06/10/16 0747  BP: (!) 142/98 (!) 146/98  Pulse: 73 78  Resp: 16 17  Temp: 98 F (36.7 C) 98.4 F (36.9 C)   Vitals:   06/09/16 1800 06/09/16 2203 06/10/16 0612 06/10/16 0747  BP: 137/89 (!) 145/98 (!) 142/98 (!) 146/98  Pulse: 80 75 73 78  Resp: _0 Temp: 98.7 F (37.1 C) 98 F (36.7 C) 98 F (36.7 C) 98.4 F (36.9 C)  TempSrc: Oral Oral Oral Oral  SpO2: 100% 100% 98% 100%  Weight:      Height:        General: Pt is alert, awake, not in acute distress Cardiovascular: RRR, S1/S2 +, no rubs, no gallops Respiratory: CTA bilaterally, no wheezing, no rhonchi Abdominal: Soft, NT, ND, bowel sounds + Extremities: no edema, no cyanosis    The results of significant diagnostics from this hospitalization (including imaging, microbiology, ancillary and laboratory) are listed below for reference.     Microbiology: No results found for this or any previous visit (from the past 240 hour(s)).   Labs: BNP (last 3 results) No results for input(s): BNP in the last 8760 hours. Basic Metabolic Panel:  Recent Labs Lab 06/08/16 1650 06/08/16 2244 06/09/16 0328 06/09/16 0654 06/10/16 0555  NA 122* 132* 136 134* 134*  K 4.7 4.3 4.1 3.4* 4.7  CL 84*  95* 104 105 102  CO2 19* _1 GLUCOSE 592* 308* 137* 186* 229*  BUN 30* 24* _2 CREATININE 1.83* 1.37* 0.95 0.93 0.91  CALCIUM 9.6 9.2 8.0* 8.0* 9.0  MG  --  2.2  --   --   --   PHOS  --  1.8*  --   --   --    Liver Function Tests: No results for input(s): AST, ALT, ALKPHOS, BILITOT, PROT, ALBUMIN in the last 168 hours. No results for input(s): LIPASE, AMYLASE in the last 168 hours. No results for input(s): AMMONIA in the last 168 hours. CBC:  Recent Labs Lab 06/08/16 1650  WBC 7.2  HGB 12.9*  HCT 36.4*  MCV 92.4  PLT 146*   Cardiac Enzymes: No results for input(s): CKTOTAL, CKMB, CKMBINDEX, TROPONINI in the last 168 hours. BNP: Invalid input(s): POCBNP CBG:  Recent Labs Lab 06/09/16 1148 06/09/16 1713 06/09/16 2159 06/10/16  0744 06/10/16 1112  GLUCAP 209* 179* 193* 209* 280*   D-Dimer No results for input(s): DDIMER in the last 72 hours. Hgb A1c No results for input(s): HGBA1C in the last 72 hours. Lipid Profile  Recent Labs  06/09/16 0328 06/09/16 0654  CHOL 308*  --   HDL NOT REPORTED DUE TO HIGH TRIGLYCERIDES  --   LDLCALC UNABLE TO CALCULATE IF TRIGLYCERIDE OVER 400 mg/dL  --   TRIG 1,422* 1,234*  CHOLHDL NOT REPORTED DUE TO HIGH TRIGLYCERIDES  --    Thyroid function studies  Recent Labs  06/08/16 2243  TSH 1.396   Anemia work up No results for input(s): VITAMINB12, FOLATE, FERRITIN, TIBC, IRON, RETICCTPCT in the last 72 hours. Urinalysis    Component Value Date/Time   COLORURINE YELLOW 06/08/2016 [place] 06/08/2016 1707   LABSPEC 1.036 (H) 06/08/2016 1707   PHURINE 5.5 06/08/2016 1707   GLUCOSEU >1000 (A) 06/08/2016 1707   HGBUR TRACE (A) 06/08/2016 1707   BILIRUBINUR NEGATIVE 06/08/2016 1707   KETONESUR 15 (A) 06/08/2016 1707   PROTEINUR NEGATIVE 06/08/2016 1707   NITRITE NEGATIVE 06/08/2016 1707   LEUKOCYTESUR NEGATIVE 06/08/2016 1707   Sepsis Labs Invalid input(s): PROCALCITONIN,  WBC,   LACTICIDVEN Microbiology No results found for this or any previous visit (from the past 240 hour(s)).   Time coordinating discharge: Over 30 minutes  SIGNED:   Birdie Hopes, MD  [facility] 06/10/2016, 11:38 AM Pager   If 7PM-7AM, please contact night-coverage [email or web address] Password [email or web address]

## 2016-06-10 NOTE — Progress Notes (Signed)
Inpatient Diabetes Program Recommendations  AACE/ADA: New Consensus Statement on Inpatient Glycemic Control (2015)  Target Ranges:  Prepandial:   less than 140 mg/dL      Peak postprandial:   less than 180 mg/dL (1-2 hours)      Critically ill patients:  140 - 180 mg/dL   Lab Results  Component Value Date   GLUCAP 209 (H) 06/10/2016    Review of Glycemic Control  FBS this am > 180 mg/dL. Needs insulin adjustment.  Inpatient Diabetes Program Recommendations:    Increase Lantus to 14 units QHS.  Will continue to follow. Thank you. Lorenda Peck, RD, LDN, CDE Inpatient Diabetes Coordinator [phone]

## 2016-07-15 ENCOUNTER — Encounter: Payer: BLUE CROSS/BLUE SHIELD | Attending: Internal Medicine | Admitting: Skilled Nursing Facility1

## 2016-07-15 ENCOUNTER — Encounter: Payer: Self-pay | Admitting: Skilled Nursing Facility1

## 2016-07-15 DIAGNOSIS — E1165 Type 2 diabetes mellitus with hyperglycemia: Secondary | ICD-10-CM | POA: Insufficient documentation

## 2016-07-15 DIAGNOSIS — E1100 Type 2 diabetes mellitus with hyperosmolarity without nonketotic hyperglycemic-hyperosmolar coma (NKHHC): Secondary | ICD-10-CM

## 2016-07-15 NOTE — Patient Instructions (Addendum)
-  Take your insulin every night at 10pm --Always bring your meter with you everywhere you go -Always Properly dispose of your needles:  -Discard in a hard plastic/metal container with a lid (something the needle can't puncture)  -Write Do Not Recycle on the outside of the container  -Example: A laundry detergent bottle -Never use the same needle more than once -Eat 3-4 carbohydrate choices for each meal and 1-2 for each snack -A meal: carbohydrates, protein, vegetable -A snack: A Fruit OR Vegetable AND Protein  -Try to be more active -Always pay attention to your body keeping watchful of possible low blood sugar (below 70) or high blood sugar (above 200)   -For your baked potato instead of butter try low fat sour cream  -Try to use low fat cheese   -Try some nuts in your cereal (unsalted)   -Try 1% milk

## 2016-07-15 NOTE — Progress Notes (Signed)
Diabetes Self-Management Education  Visit Type: First/Initial  Appt. Start Time: 9:20 Appt. End Time: 10:50  07/15/2016  Mr. Jorge Decker, identified by name and date of birth, is a 65 y.o. male with a diagnosis of Diabetes: Type 2.   ASSESSMENT  Height 5\' 6"  (1.676 m), weight 174 lb 3.2 oz (79 kg). Body mass index is 28.12 kg/m. Pt states he Sometimes just checks his blood sugar to make sure every thing is okay. Pt understands how food effects his sugar. Pt states his eyes are much better and does not even need his glasses. Pt states he used to drink a lot of liquor in soda but not anymore-used to get drunk but not anymore. Pt states he loves to fish.     Diabetes Self-Management Education - 07/15/16 0921      Visit Information   Visit Type First/Initial     Initial Visit   Diabetes Type Type 2   Are you currently following a meal plan? No   Are you taking your medications as prescribed? No   Date Diagnosed Last month     Health Coping   How would you rate your overall health? Good     Psychosocial Assessment   Patient Belief/Attitude about Diabetes Denial   Self-care barriers Unable to determine   Self-management support Friends;Family     Pre-Education Assessment   Patient understands the diabetes disease and treatment process. Needs Instruction   Patient understands incorporating nutritional management into lifestyle. Needs Instruction   Patient undertands incorporating physical activity into lifestyle. Needs Instruction   Patient understands using medications safely. Needs Instruction   Patient understands monitoring blood glucose, interpreting and using results Needs Instruction   Patient understands prevention, detection, and treatment of acute complications. Needs Instruction   Patient understands prevention, detection, and treatment of chronic complications. Needs Instruction   Patient understands how to develop strategies to address psychosocial issues. Needs  Instruction   Patient understands how to develop strategies to promote health/change behavior. Needs Instruction     Complications   Last HgB A1C per patient/outside source 15.5 %   How often do you check your blood sugar? 1-2 times/day   Fasting Blood glucose range (mg/dL) 70-129   Postprandial Blood glucose range (mg/dL) 70-129   Have you had a dilated eye exam in the past 12 months? (P)  Yes   Have you had a dental exam in the past 12 months? (P)  Yes   Are you checking your feet? (P)  Yes   How many days per week are you checking your feet? (P)  7     Dietary Intake   Breakfast (P)  cereal with banana   Snack (morning) (P)  fruit   Lunch (P)  sandwich----beans ----vegetables   Snack (afternoon) (P)  peanutbutter crackers-----crackers   Dinner (P)  beans and squash   Snack (evening) (P)  cornflakes   Beverage(s) (P)  milk, beer, shot of spirit, juice, gatorade     Exercise   Exercise Type (P)  Light (walking / raking leaves)   How many days per week to you exercise? (P)  7     Patient Education   Previous Diabetes Education (P)  No      Individualized Plan for Diabetes Self-Management Training:   Learning Objective:  Patient will have a greater understanding of diabetes self-management. Patient education plan is to attend individual and/or group sessions per assessed needs and concerns.   Plan:   Patient Instructions  -  Take your insulin every night at 10pm --Always bring your meter with you everywhere you go -Always Properly dispose of your needles:  -Discard in a hard plastic/metal container with a lid (something the needle can't puncture)  -Write Do Not Recycle on the outside of the container  -Example: A laundry detergent bottle -Never use the same needle more than once -Eat 3-4 carbohydrate choices for each meal and 1-2 for each snack -A meal: carbohydrates, protein, vegetable -A snack: A Fruit OR Vegetable AND Protein  -Try to be more active -Always pay  attention to your body keeping watchful of possible low blood sugar (below 70) or high blood sugar (above 200)   -For your baked potato instead of butter try low fat sour cream  -Try to use low fat cheese   -Try some nuts in your cereal (unsalted)   -Try 1% milk    Expected Outcomes:     Education material provided: Living Well with Diabetes, Meal plan card, My Plate and Snack sheet  If problems or questions, patient to contact team via:  Phone  Future DSME appointment:  ish

## 2018-12-21 DIAGNOSIS — E782 Mixed hyperlipidemia: Secondary | ICD-10-CM | POA: Diagnosis not present

## 2018-12-21 DIAGNOSIS — E538 Deficiency of other specified B group vitamins: Secondary | ICD-10-CM | POA: Diagnosis not present

## 2018-12-21 DIAGNOSIS — E119 Type 2 diabetes mellitus without complications: Secondary | ICD-10-CM | POA: Diagnosis not present

## 2018-12-21 DIAGNOSIS — Z0001 Encounter for general adult medical examination with abnormal findings: Secondary | ICD-10-CM | POA: Diagnosis not present

## 2018-12-21 DIAGNOSIS — M109 Gout, unspecified: Secondary | ICD-10-CM | POA: Diagnosis not present

## 2018-12-21 DIAGNOSIS — N5201 Erectile dysfunction due to arterial insufficiency: Secondary | ICD-10-CM | POA: Diagnosis not present

## 2018-12-21 DIAGNOSIS — H6120 Impacted cerumen, unspecified ear: Secondary | ICD-10-CM | POA: Diagnosis not present

## 2018-12-21 DIAGNOSIS — Z125 Encounter for screening for malignant neoplasm of prostate: Secondary | ICD-10-CM | POA: Diagnosis not present

## 2018-12-21 DIAGNOSIS — I10 Essential (primary) hypertension: Secondary | ICD-10-CM | POA: Diagnosis not present

## 2018-12-21 DIAGNOSIS — Z23 Encounter for immunization: Secondary | ICD-10-CM | POA: Diagnosis not present

## 2018-12-21 DIAGNOSIS — E559 Vitamin D deficiency, unspecified: Secondary | ICD-10-CM | POA: Diagnosis not present

## 2018-12-21 DIAGNOSIS — E781 Pure hyperglyceridemia: Secondary | ICD-10-CM | POA: Diagnosis not present

## 2018-12-21 LAB — HEMOGLOBIN A1C: Hemoglobin A1C: 6.5

## 2019-01-18 DIAGNOSIS — R972 Elevated prostate specific antigen [PSA]: Secondary | ICD-10-CM | POA: Diagnosis not present

## 2019-01-31 DIAGNOSIS — E782 Mixed hyperlipidemia: Secondary | ICD-10-CM | POA: Diagnosis not present

## 2019-01-31 DIAGNOSIS — E1165 Type 2 diabetes mellitus with hyperglycemia: Secondary | ICD-10-CM | POA: Diagnosis not present

## 2019-01-31 DIAGNOSIS — D649 Anemia, unspecified: Secondary | ICD-10-CM | POA: Diagnosis not present

## 2019-01-31 DIAGNOSIS — I10 Essential (primary) hypertension: Secondary | ICD-10-CM | POA: Diagnosis not present

## 2019-01-31 DIAGNOSIS — E119 Type 2 diabetes mellitus without complications: Secondary | ICD-10-CM | POA: Diagnosis not present

## 2019-03-14 ENCOUNTER — Other Ambulatory Visit: Payer: Self-pay

## 2019-03-14 NOTE — Patient Outreach (Signed)
  [facility]) Care Management Chronic Special Needs Program  03/14/2019  Name: Dilyn Smiles Bissette DOB: [birth year]  MRN: [id number]  Mr. Bentlie Catanzaro is enrolled in a chronic special needs plan for Diabetes. Chronic Care Management Coordinator telephoned client to review health risk assessment and to develop individualized care plan.  Introduced the chronic care management program, importance of client participation, and taking their care plan to all provider appointments and inpatient facilities.  Reviewed the transition of care process and possible referral to community care management.  Subjective: Client states his diabetes is doing good and he now only takes Metformin.  States his blood sugars range 100-115 most of the time.  States he walks 20-30 minutes most days and he cuts grass.  States he now does not have any problem with the cost of his medications since he has Medicare now.  States his B/P is good.  Goals Addressed            This Visit's Progress   .  Acknowledge receipt of Programme researcher, broadcasting/film/video      . Client understands the importance of follow-up with providers by attending scheduled visits      . Client will use Assistive Devices as needed and verbalize understanding of device use      . Client will verbalize knowledge of self management of Hypertension as evidences by BP reading of 140/90 or less; or as defined by provider      . HEMOGLOBIN A1C < 7.0       Diabetes self management actions:  Glucose monitoring per provider recommendations  Perform Quality checks on blood meter  Eat Healthy  Check feet daily  Visit provider every 3-6 months as directed  Hbg A1C level every 3-6 months.  Eye Exam yearly    . Maintain timely refills of diabetic medication as prescribed within the year .      Marland Kitchen Obtain annual  Lipid Profile, LDL-C      . Obtain Annual Eye (retinal)  Exam       . Obtain Annual Foot Exam      . Obtain annual screen for micro  albuminuria (urine) , nephropathy (kidney problems)      . Obtain Hemoglobin A1C at least 2 times per year      . Visit Primary Care Provider or Endocrinologist at least 2 times per year        Client is  meeting diabetes self management goal of hemoglobin A1C of <7% with last reading of 6.5%.  Client has not had annual eye or foot exam yet.  Instructed on importance of getting these annual exams. Client declines social work referral for [employer] but would like information on them Discussed COVID19 cause, symptoms, precautions (social distancing, stay at home order, hand washing), confirmed client knows how to contact provider. Plan:  Send successful outreach letter with a copy of their individualized care plan, Send individual care plan to provider and Send educational material HTN. Advanced Directives   Chronic care management coordination will outreach in:  6 Months    [place], [facility], [place] Management Coordinator [place] [facility] [phone]

## 2019-04-04 DIAGNOSIS — N5201 Erectile dysfunction due to arterial insufficiency: Secondary | ICD-10-CM | POA: Diagnosis not present

## 2019-04-04 DIAGNOSIS — N400 Enlarged prostate without lower urinary tract symptoms: Secondary | ICD-10-CM | POA: Diagnosis not present

## 2019-04-04 DIAGNOSIS — R972 Elevated prostate specific antigen [PSA]: Secondary | ICD-10-CM | POA: Diagnosis not present

## 2019-04-17 DIAGNOSIS — E782 Mixed hyperlipidemia: Secondary | ICD-10-CM | POA: Diagnosis not present

## 2019-04-17 DIAGNOSIS — E119 Type 2 diabetes mellitus without complications: Secondary | ICD-10-CM | POA: Diagnosis not present

## 2019-04-17 DIAGNOSIS — E1165 Type 2 diabetes mellitus with hyperglycemia: Secondary | ICD-10-CM | POA: Diagnosis not present

## 2019-04-17 DIAGNOSIS — D649 Anemia, unspecified: Secondary | ICD-10-CM | POA: Diagnosis not present

## 2019-04-17 DIAGNOSIS — I10 Essential (primary) hypertension: Secondary | ICD-10-CM | POA: Diagnosis not present

## 2019-05-23 DIAGNOSIS — R972 Elevated prostate specific antigen [PSA]: Secondary | ICD-10-CM | POA: Diagnosis not present

## 2019-05-24 DIAGNOSIS — I10 Essential (primary) hypertension: Secondary | ICD-10-CM | POA: Diagnosis not present

## 2019-05-24 DIAGNOSIS — E119 Type 2 diabetes mellitus without complications: Secondary | ICD-10-CM | POA: Diagnosis not present

## 2019-05-24 DIAGNOSIS — E1165 Type 2 diabetes mellitus with hyperglycemia: Secondary | ICD-10-CM | POA: Diagnosis not present

## 2019-05-24 DIAGNOSIS — E782 Mixed hyperlipidemia: Secondary | ICD-10-CM | POA: Diagnosis not present

## 2019-05-24 DIAGNOSIS — D649 Anemia, unspecified: Secondary | ICD-10-CM | POA: Diagnosis not present

## 2019-05-30 DIAGNOSIS — N400 Enlarged prostate without lower urinary tract symptoms: Secondary | ICD-10-CM | POA: Diagnosis not present

## 2019-05-30 DIAGNOSIS — N5201 Erectile dysfunction due to arterial insufficiency: Secondary | ICD-10-CM | POA: Diagnosis not present

## 2019-05-30 DIAGNOSIS — C61 Malignant neoplasm of prostate: Secondary | ICD-10-CM | POA: Diagnosis not present

## 2019-06-14 ENCOUNTER — Encounter: Payer: Self-pay | Admitting: Radiation Oncology

## 2019-06-14 NOTE — Progress Notes (Signed)
GU Location of Tumor / Histology: prostatic adenocarcinoma  If Prostate Cancer, Gleason Score is (3 + 4) and PSA is (5.41). Prostate volume: 49.41 grams  Jorge Decker was referred by Dr. Marcellus Scott to Dr. Gloriann Loan for further evaluation of an elevated PSA. Reports he sees Dr. Inda Merlin yearly for routine physical. Reports it wasn't until this year that Dr. Inda Merlin found an elevated PSA.   Biopsies of prostate (if applicable) revealed:    Past/Anticipated interventions by urology, if any: prostate biopsy, patient undecided about treatment thus referred to Dr. Tammi Klippel to discuss radiation treatment options  Past/Anticipated interventions by medical oncology, if any: no  Weight changes, if any: no  Bowel/Bladder complaints, if any: IPSS 2. SHIM 16. Reports urinary urgency. Denies urinary leakage or incontinence. Reports mild intermittent dysuria. Denies hematuria.  Mild ED responsive to Cialis. Reports nocturia x 0-1.   Nausea/Vomiting, if any: no  Pain issues, if any:  no  SAFETY ISSUES:  Prior radiation? no  Pacemaker/ICD? no  Possible current pregnancy? no, male patient  Is the patient on methotrexate? no  Current Complaints / other details:  68 year old male. Single. 1 daughter, 5 sons. Lives in [place]. No hx of abd surgery. Denies a family hx of prostate cancer. Patient scheduled to talk to Dr. Inda Merlin next Thursday to follow up on medications. Also, patient scheduled to follow up with Dr. Gloriann Loan on September 9th.

## 2019-06-15 ENCOUNTER — Other Ambulatory Visit: Payer: Self-pay

## 2019-06-15 ENCOUNTER — Encounter: Payer: Self-pay | Admitting: Radiation Oncology

## 2019-06-15 ENCOUNTER — Ambulatory Visit
Admission: RE | Admit: 2019-06-15 | Discharge: 2019-06-15 | Disposition: A | Discharge status: Home / Self Care | Payer: HMO | Source: Ambulatory Visit | Attending: Radiation Oncology | Admitting: Radiation Oncology

## 2019-06-15 VITALS — BP 143/91 | HR 90 | Temp 98.7°F | Resp 20 | Ht 67.0 in | Wt 198.0 lb

## 2019-06-15 DIAGNOSIS — Z801 Family history of malignant neoplasm of trachea, bronchus and lung: Secondary | ICD-10-CM | POA: Insufficient documentation

## 2019-06-15 DIAGNOSIS — Z79899 Other long term (current) drug therapy: Secondary | ICD-10-CM | POA: Insufficient documentation

## 2019-06-15 DIAGNOSIS — E119 Type 2 diabetes mellitus without complications: Secondary | ICD-10-CM | POA: Insufficient documentation

## 2019-06-15 DIAGNOSIS — M109 Gout, unspecified: Secondary | ICD-10-CM | POA: Diagnosis not present

## 2019-06-15 DIAGNOSIS — C61 Malignant neoplasm of prostate: Secondary | ICD-10-CM

## 2019-06-15 DIAGNOSIS — E785 Hyperlipidemia, unspecified: Secondary | ICD-10-CM | POA: Insufficient documentation

## 2019-06-15 DIAGNOSIS — I10 Essential (primary) hypertension: Secondary | ICD-10-CM | POA: Insufficient documentation

## 2019-06-15 HISTORY — DX: Malignant neoplasm of prostate: C61

## 2019-06-15 HISTORY — DX: Hyperlipidemia, unspecified: E78.5

## 2019-06-15 NOTE — Progress Notes (Signed)
Due to [place] appointment was scheduled to be over the telephone. Patient presented to the clinic instead. Patient evaluated in exam room and physician paged.

## 2019-06-15 NOTE — Progress Notes (Signed)
Radiation Oncology         [phone]) [phone] ________________________________  Initial outpatient Consultation  Name: Jorge Decker MRN: [id number]  Date: 06/15/2019  DOB: 12/10/1950  AQ:[id number], Herbie [place], MD  Lucas Mallow, MD   REFERRING PHYSICIAN: Lucas Mallow, MD  DIAGNOSIS: 68 y.o. gentleman with Stage T1c adenocarcinoma of the prostate with Gleason score of 3+4, and PSA of 5.41.    ICD-10-CM   1. Malignant neoplasm of prostate ([place])  C61     HISTORY OF PRESENT ILLNESS: Jorge Decker is a 68 y.o. male with a diagnosis of prostate cancer. He was noted to have an elevated PSA of 4.65 by his primary care physician, Dr. Inda Merlin.  A repeat PSA on 01/18/2019 was further elevated at 5.41.  Accordingly, he was referred for evaluation in urology by Dr. Gloriann Loan on 04/04/2019,  digital rectal examination was performed at that time showing no abnormalities. The patient proceeded to transrectal ultrasound with 12 biopsies of the prostate on 05/23/2019.  The prostate volume measured 49.41 cc.  Out of 12 core biopsies, 5 were positive, all on the right side.  The maximum Gleason score was 3+4, and this was seen in right base, right mid lateral, and right apex lateral. Gleason 3+3 was seen in right mid and right base lateral. HG/PIN was noted in left base.  The patient reviewed the biopsy results with his urologist and he has kindly been referred today for discussion of potential radiation treatment options.   PREVIOUS RADIATION THERAPY: No  PAST MEDICAL HISTORY:  Past Medical History:  Diagnosis Date  . Diabetes mellitus without complication ([place])   . Gout   . Hyperlipidemia   . Hypertension   . Prostate cancer ([place])       PAST SURGICAL HISTORY: Past Surgical History:  Procedure Laterality Date  . PROSTATE BIOPSY      FAMILY HISTORY:  Family History  Problem Relation Age of Onset  . Hypertension Mother   . Cancer Mother        unknown  . Heart failure Sister   . Lung cancer  Brother        smoker  . Diabetes Neg Hx   . Autoimmune disease Neg Hx   . Prostate cancer Neg Hx   . Breast cancer Neg Hx   . Colon cancer Neg Hx     SOCIAL HISTORY:  Social History   Socioeconomic History  . Marital status: Single    Spouse name: Not on file  . Number of children: 6  . Years of education: Not on file  . Highest education level: Not on file  Occupational History  . Not on file  Social Needs  . Financial resource strain: Not on file  . Food insecurity    Worry: Never true    Inability: Never true  . Transportation needs    Medical: No    Non-medical: No  Tobacco Use  . Smoking status: Never Smoker  . Smokeless tobacco: Never Used  Substance and Sexual Activity  . Alcohol use: Yes    Comment: 5 per day  . Drug use: No  . Sexual activity: Yes    Comment: mild ED responsive to Cialis  Lifestyle  . Physical activity    Days per week: Not on file    Minutes per session: Not on file  . Stress: Not on file  Relationships  . Social connections    Talks on phone: Not on file  Gets together: Not on file    Attends religious service: Not on file    Active member of club or organization: Not on file    Attends meetings of clubs or organizations: Not on file    Relationship status: Not on file  . Intimate partner violence    Fear of current or ex partner: Not on file    Emotionally abused: Not on file    Physically abused: Not on file    Forced sexual activity: Not on file  Other Topics Concern  . Not on file  Social History Narrative  . Not on file    ALLERGIES: Lisinopril  MEDICATIONS:  Current Outpatient Medications  Medication Sig Dispense Refill  . allopurinol (ZYLOPRIM) 300 MG tablet Take 300 mg by mouth every morning.  1  . amLODipine (NORVASC) 5 MG tablet Take 5 mg by mouth daily.    . blood glucose meter kit and supplies KIT Dispense based on patient and insurance preference. Use up to four times daily as directed. (FOR ICD-9 250.00,  250.01). 1 each 0  . Cholecalciferol (VITAMIN D3) 1000 units CAPS Take 2,000 Units by mouth daily.    . metFORMIN (GLUCOPHAGE) 1000 MG tablet Take 1 tablet (1,000 mg total) by mouth 2 (two) times daily with a meal. 60 tablet 0  . metoprolol succinate (TOPROL-XL) 50 MG 24 hr tablet Take 50 mg by mouth daily.    . rosuvastatin (CRESTOR) 5 MG tablet Take 5 mg by mouth daily.    . sildenafil (REVATIO) 20 MG tablet TAKE 2 TO 4 TABLETS PRIOR TO INTERCOURSE ( DO NOT TAKE MORE THAN THAT ONCE PER 24 HOURS OR MORE THAN 3 TIMES PER WEEK)    . valsartan-hydrochlorothiazide (DIOVAN-HCT) 160-25 MG tablet Take 1 tablet by mouth daily.    . vitamin B-12 (CYANOCOBALAMIN) 500 MCG tablet Take 500 mcg by mouth daily.    . probenecid (BENEMID) 500 MG tablet Take 500 mg by mouth daily.     No current facility-administered medications for this encounter.     REVIEW OF SYSTEMS:  On review of systems, the patient reports that he is doing well overall. He denies any chest pain, shortness of breath, cough, fevers, chills, night sweats, unintended weight changes. He denies any bowel disturbances, and denies abdominal pain, nausea or vomiting. He denies any new musculoskeletal or joint aches or pains. His IPSS was 2, indicating mild symptoms with urinary urgency and mild intermittent dysuria. His SHIM was 16, indicating he has moderate erectile dysfunction. He takes Cialis with success. A complete review of systems is obtained and is otherwise negative.  PHYSICAL EXAM:  Wt Readings from Last 3 Encounters:  06/15/19 198 lb (89.8 kg)  07/15/16 174 lb 3.2 oz (79 kg)  06/08/16 165 lb 5.5 oz (75 kg)   Temp Readings from Last 3 Encounters:  06/15/19 98.7 F (37.1 C) (Oral)  06/10/16 98.2 F (36.8 C) (Oral)  05/10/15 98.6 F (37 C) (Oral)   BP Readings from Last 3 Encounters:  06/15/19 (!) 143/91  06/10/16 (!) 151/100  05/10/15 142/95   Pulse Readings from Last 3 Encounters:  06/15/19 90  06/10/16 93  05/10/15 79    Pain Assessment Pain Score: 0-No pain/10  In general this is a well appearing African American gentleman in no acute distress. He's alert and oriented x4 and appropriate throughout the examination. Cardiopulmonary assessment is negative for acute distress and he exhibits normal effort.    KPS = 90  100 -  Normal; no complaints; no evidence of disease. 90   - Able to carry on normal activity; minor signs or symptoms of disease. 80   - Normal activity with effort; some signs or symptoms of disease. 49   - Cares for self; unable to carry on normal activity or to do active work. 60   - Requires occasional assistance, but is able to care for most of his personal needs. 50   - Requires considerable assistance and frequent medical care. 38   - Disabled; requires special care and assistance. 36   - Severely disabled; hospital admission is indicated although death not imminent. 19   - Very sick; hospital admission necessary; active supportive treatment necessary. 10   - Moribund; fatal processes progressing rapidly. 0     - Dead  Karnofsky DA, Abelmann [place], Craver LS and Burchenal [facility] [phone]) The use of the nitrogen mustards in the palliative treatment of carcinoma: with particular reference to bronchogenic carcinoma Cancer 1 634-56  LABORATORY DATA:  Lab Results  Component Value Date   WBC 7.2 06/08/2016   HGB 12.9 (L) 06/08/2016   HCT 36.4 (L) 06/08/2016   MCV 92.4 06/08/2016   PLT 146 (L) 06/08/2016   Lab Results  Component Value Date   NA 134 (L) 06/10/2016   K 4.7 06/10/2016   CL 102 06/10/2016   CO2 24 06/10/2016   No results found for: ALT, AST, GGT, ALKPHOS, BILITOT   RADIOGRAPHY: No results found.    IMPRESSION/PLAN: 1. 68 y.o. gentleman with Stage T1c adenocarcinoma of the prostate with Gleason Score of 3+4, and PSA of 5.41. We discussed the patient's workup and outlined the nature of prostate cancer in this setting. The patient's T stage, Gleason's score, and PSA put him  into the favorable intermediate risk group. Accordingly, he is eligible for a variety of potential treatment options including brachytherapy, 5.5 weeks of external radiation or prostatectomy. We discussed the available radiation techniques, and focused on the details and logistics of delivery. We discussed and outlined the risks, benefits, short and long-term effects associated with radiotherapy and compared and contrasted these with prostatectomy. We discussed the role of SpaceOAR in reducing the rectal toxicity associated with radiotherapy.  He was encouraged to ask questions that were answered to his stated satisfaction.  At the conclusion of our conversation the patient is interested in moving forward with brachytherapy and use of SpaceOAR to reduce rectal toxicity from radiotherapy.  We will share our discussion with Dr. Gloriann Loan and move forward with coordinating this procedure and scheduling his [facility] planning appointment in the near future.  He has a follow-up appointment with Dr. Gloriann Loan scheduled on 07/04/2019.  The patient will be contacted by Romie Jumper in our office, who will be working closely with him to coordinate OR scheduling and pre and post procedure appointments.  We will contact the pharmaceutical rep to ensure that Melvin is available at the time of procedure.  He will have a prostate MRI following his post-seed CT SIM to confirm appropriate distribution of the [place].  We enjoyed meeting him today and look forward to continuing to participate in his care.   [name], PA-C    Tyler Pita, MD  [facility] Direct Dial: [phone]  Fax: [phone] [email or web address]  Skype  [email or web address]   This document serves as a record of services personally performed by Tyler Pita, MD and Freeman Caldron, PA-C. It was created on their behalf by Wilburn Mylar, a trained  medical scribe. The creation of this record is based on the scribe's personal  observations and the provider's statements to them. This document has been checked and approved by the attending provider.

## 2019-06-15 NOTE — Progress Notes (Signed)
See progress notes under physician encounter. 

## 2019-06-18 DIAGNOSIS — C61 Malignant neoplasm of prostate: Secondary | ICD-10-CM | POA: Insufficient documentation

## 2019-06-19 ENCOUNTER — Telehealth: Payer: Self-pay | Admitting: *Deleted

## 2019-06-19 NOTE — Telephone Encounter (Signed)
CALLED PATIENT TO ASK QUESTIONS, LVM FOR A RETURN CALL 

## 2019-06-20 ENCOUNTER — Telehealth: Payer: Self-pay | Admitting: *Deleted

## 2019-06-20 NOTE — Telephone Encounter (Signed)
CALLED PATIENT TO GIVE UPDATE, SPOKE WITH PATIENT. 

## 2019-06-21 ENCOUNTER — Other Ambulatory Visit: Payer: Self-pay | Admitting: Urology

## 2019-06-21 ENCOUNTER — Telehealth: Payer: Self-pay | Admitting: *Deleted

## 2019-06-21 DIAGNOSIS — C61 Malignant neoplasm of prostate: Secondary | ICD-10-CM | POA: Diagnosis not present

## 2019-06-21 DIAGNOSIS — E782 Mixed hyperlipidemia: Secondary | ICD-10-CM | POA: Diagnosis not present

## 2019-06-21 DIAGNOSIS — E1165 Type 2 diabetes mellitus with hyperglycemia: Secondary | ICD-10-CM | POA: Diagnosis not present

## 2019-06-21 DIAGNOSIS — D649 Anemia, unspecified: Secondary | ICD-10-CM | POA: Diagnosis not present

## 2019-06-21 DIAGNOSIS — I10 Essential (primary) hypertension: Secondary | ICD-10-CM | POA: Diagnosis not present

## 2019-06-21 NOTE — Telephone Encounter (Signed)
Called patient to inform of pre-seed appts. For 06-29-19 and his implant for 07-30-19, spoke with patient and he is aware of these appts.

## 2019-06-26 ENCOUNTER — Telehealth: Payer: Self-pay | Admitting: *Deleted

## 2019-06-26 NOTE — Telephone Encounter (Signed)
RETURNED PATIENT'S PHONE CALL, SPOKE WITH PATIENT. ?

## 2019-06-28 ENCOUNTER — Telehealth: Payer: Self-pay | Admitting: *Deleted

## 2019-06-28 NOTE — Telephone Encounter (Signed)
Returned patient's phone call, lvm 

## 2019-06-28 NOTE — Telephone Encounter (Signed)
CALLED PATIENT TO REMIND OF PRE-SEED APPTS. AND CHEST X-RAY AND EKG FOR 06-29-19, SPOKE WITH PATIENT AND HE IS AWARE OF THESE APPTS.

## 2019-06-29 ENCOUNTER — Ambulatory Visit (HOSPITAL_COMMUNITY)
Admission: RE | Admit: 2019-06-29 | Discharge: 2019-06-29 | Disposition: A | Discharge status: Home / Self Care | Payer: HMO | Source: Ambulatory Visit | Attending: Urology | Admitting: Urology

## 2019-06-29 ENCOUNTER — Ambulatory Visit: Payer: HMO | Attending: Urology | Admitting: Urology

## 2019-06-29 ENCOUNTER — Other Ambulatory Visit: Payer: Self-pay

## 2019-06-29 ENCOUNTER — Encounter: Payer: Self-pay | Admitting: Medical Oncology

## 2019-06-29 ENCOUNTER — Ambulatory Visit
Admission: RE | Admit: 2019-06-29 | Discharge: 2019-06-29 | Disposition: A | Discharge status: Home / Self Care | Payer: HMO | Source: Ambulatory Visit | Attending: Urology | Admitting: Urology

## 2019-06-29 ENCOUNTER — Encounter (HOSPITAL_COMMUNITY)
Admission: RE | Admit: 2019-06-29 | Discharge: 2019-06-29 | Disposition: A | Discharge status: Home / Self Care | Payer: HMO | Source: Ambulatory Visit | Attending: Urology | Admitting: Urology

## 2019-06-29 DIAGNOSIS — C61 Malignant neoplasm of prostate: Secondary | ICD-10-CM | POA: Insufficient documentation

## 2019-06-29 DIAGNOSIS — Z01818 Encounter for other preprocedural examination: Secondary | ICD-10-CM | POA: Diagnosis not present

## 2019-06-29 NOTE — Progress Notes (Signed)
  Radiation Oncology         [phone] ________________________________  Name: Jorge Decker MRN: [id number]  Date: 06/29/2019  DOB: 09-Dec-1950  SIMULATION AND TREATMENT PLANNING NOTE PUBIC ARCH STUDY  Jorge Decker, Jorge Baltimore, MD  Jorge Huddle, MD  DIAGNOSIS: 68 y.o. gentleman with Stage T1c adenocarcinoma of the prostate with Gleason score of 3+4, and PSA of 5.41     ICD-10-CM   1. Malignant neoplasm of prostate ([place])  C61     COMPLEX SIMULATION:  The patient presented today for evaluation for possible prostate seed implant. He was brought to the radiation planning suite and placed supine on the CT couch. A 3-dimensional image study set was obtained in upload to the planning computer. There, on each axial slice, I contoured the prostate gland. Then, using three-dimensional radiation planning tools I reconstructed the prostate in view of the structures from the transperineal needle pathway to assess for possible pubic arch interference. In doing so, I did not appreciate any pubic arch interference. Also, the patient's prostate volume was estimated based on the drawn structure. The volume was 50 cc.  Given the pubic arch appearance and prostate volume, patient remains a good candidate to proceed with prostate seed implant. Today, he freely provided informed written consent to proceed.    PLAN: The patient will undergo prostate seed implant.   ________________________________  Sheral Apley. Tammi Klippel, M.D.

## 2019-06-29 NOTE — Progress Notes (Signed)
Introduced myself to Mr. Jorge Decker as the prostate nurse navigator and discussed my role. I was unable to meet him 8/21, when he consulted  with Dr. Tammi Klippel. After discussing his options, he has chosen brachytherapy. Surgery is scheduled for 07/30/19. I gave him my business card and asked him to call me with questions or concerns. He voiced understanding.

## 2019-07-04 DIAGNOSIS — N401 Enlarged prostate with lower urinary tract symptoms: Secondary | ICD-10-CM | POA: Diagnosis not present

## 2019-07-04 DIAGNOSIS — C61 Malignant neoplasm of prostate: Secondary | ICD-10-CM | POA: Diagnosis not present

## 2019-07-06 ENCOUNTER — Other Ambulatory Visit: Payer: Self-pay | Admitting: Urology

## 2019-07-06 DIAGNOSIS — C61 Malignant neoplasm of prostate: Secondary | ICD-10-CM

## 2019-07-11 DIAGNOSIS — D649 Anemia, unspecified: Secondary | ICD-10-CM | POA: Diagnosis not present

## 2019-07-11 DIAGNOSIS — E782 Mixed hyperlipidemia: Secondary | ICD-10-CM | POA: Diagnosis not present

## 2019-07-11 DIAGNOSIS — I10 Essential (primary) hypertension: Secondary | ICD-10-CM | POA: Diagnosis not present

## 2019-07-11 DIAGNOSIS — E781 Pure hyperglyceridemia: Secondary | ICD-10-CM | POA: Diagnosis not present

## 2019-07-11 DIAGNOSIS — E1165 Type 2 diabetes mellitus with hyperglycemia: Secondary | ICD-10-CM | POA: Diagnosis not present

## 2019-07-25 ENCOUNTER — Encounter (HOSPITAL_BASED_OUTPATIENT_CLINIC_OR_DEPARTMENT_OTHER): Payer: Self-pay | Admitting: *Deleted

## 2019-07-25 ENCOUNTER — Other Ambulatory Visit: Payer: Self-pay

## 2019-07-25 NOTE — Progress Notes (Signed)
Spoke w/ via phone for pre-op interview---Jorge Decker needs dos---- none             Decker results------ekg and chest xray done 06-29-19/ chart/epic, cbc, cmet, pt ptt to eb done 07-26-19 COVID test ------07-26-19 Arrive at -------930 NPO after ------midnight Medications to take morning of surgery -----fleets enema am, rosuvastatin, metorpolol succinate, amlodipine, allopurinol Diabetic medication -----none to take day of surgery Patient Special Instructions ----- Pre-Op special Istructions ----- Patient verbalized understanding of instructions that were given at this phone interview. Patient denies shortness of breath, chest pain, fever, cough a this phone interview.

## 2019-07-26 ENCOUNTER — Other Ambulatory Visit (HOSPITAL_COMMUNITY): Payer: HMO

## 2019-07-26 ENCOUNTER — Other Ambulatory Visit (HOSPITAL_COMMUNITY)
Admission: RE | Admit: 2019-07-26 | Discharge: 2019-07-26 | Disposition: A | Discharge status: Home / Self Care | Payer: HMO | Source: Ambulatory Visit | Attending: Urology | Admitting: Urology

## 2019-07-26 ENCOUNTER — Encounter (HOSPITAL_COMMUNITY)
Admission: RE | Admit: 2019-07-26 | Discharge: 2019-07-26 | Disposition: A | Discharge status: Home / Self Care | Payer: HMO | Source: Ambulatory Visit | Attending: Urology | Admitting: Urology

## 2019-07-26 ENCOUNTER — Encounter (INDEPENDENT_AMBULATORY_CARE_PROVIDER_SITE_OTHER): Payer: Self-pay

## 2019-07-26 DIAGNOSIS — U071 COVID-19: Secondary | ICD-10-CM | POA: Diagnosis not present

## 2019-07-26 DIAGNOSIS — C61 Malignant neoplasm of prostate: Secondary | ICD-10-CM | POA: Insufficient documentation

## 2019-07-26 DIAGNOSIS — Z01812 Encounter for preprocedural laboratory examination: Secondary | ICD-10-CM | POA: Diagnosis not present

## 2019-07-26 DIAGNOSIS — Z7984 Long term (current) use of oral hypoglycemic drugs: Secondary | ICD-10-CM | POA: Diagnosis not present

## 2019-07-26 DIAGNOSIS — E785 Hyperlipidemia, unspecified: Secondary | ICD-10-CM | POA: Insufficient documentation

## 2019-07-26 DIAGNOSIS — E119 Type 2 diabetes mellitus without complications: Secondary | ICD-10-CM | POA: Insufficient documentation

## 2019-07-26 DIAGNOSIS — I10 Essential (primary) hypertension: Secondary | ICD-10-CM | POA: Insufficient documentation

## 2019-07-26 DIAGNOSIS — Z79899 Other long term (current) drug therapy: Secondary | ICD-10-CM | POA: Insufficient documentation

## 2019-07-26 LAB — CBC
HCT: 32.7 % — ABNORMAL LOW (ref 39.0–52.0)
Hemoglobin: 10.5 g/dL — ABNORMAL LOW (ref 13.0–17.0)
MCH: 31.3 pg (ref 26.0–34.0)
MCHC: 32.1 g/dL (ref 30.0–36.0)
MCV: 97.6 fL (ref 80.0–100.0)
Platelets: 144 10*3/uL — ABNORMAL LOW (ref 150–400)
RBC: 3.35 MIL/uL — ABNORMAL LOW (ref 4.22–5.81)
RDW: 13.2 % (ref 11.5–15.5)
WBC: 6.4 10*3/uL (ref 4.0–10.5)
nRBC: 0 % (ref 0.0–0.2)

## 2019-07-26 LAB — APTT: aPTT: 31 seconds (ref 24–36)

## 2019-07-26 LAB — COMPREHENSIVE METABOLIC PANEL
ALT: 35 U/L (ref 0–44)
AST: 49 U/L — ABNORMAL HIGH (ref 15–41)
Albumin: 4.5 g/dL (ref 3.5–5.0)
Alkaline Phosphatase: 72 U/L (ref 38–126)
Anion gap: 9 (ref 5–15)
BUN: 40 mg/dL — ABNORMAL HIGH (ref 8–23)
CO2: 17 mmol/L — ABNORMAL LOW (ref 22–32)
Calcium: 8.9 mg/dL (ref 8.9–10.3)
Chloride: 105 mmol/L (ref 98–111)
Creatinine, Ser: 2.13 mg/dL — ABNORMAL HIGH (ref 0.61–1.24)
GFR calc Af Amer: 37 mL/min — ABNORMAL LOW (ref >60)
GFR calc non Af Amer: 32 mL/min — ABNORMAL LOW (ref >60)
Glucose, Bld: 124 mg/dL — ABNORMAL HIGH (ref 70–99)
Potassium: 5.3 mmol/L — ABNORMAL HIGH (ref 3.5–5.1)
Sodium: 131 mmol/L — ABNORMAL LOW (ref 135–145)
Total Bilirubin: 0.3 mg/dL (ref 0.3–1.2)
Total Protein: 7.9 g/dL (ref 6.5–8.1)

## 2019-07-26 LAB — PROTIME-INR
INR: 1.1 (ref 0.8–1.2)
Prothrombin Time: 14.1 seconds (ref 11.4–15.2)

## 2019-07-27 ENCOUNTER — Encounter: Payer: Self-pay | Admitting: *Deleted

## 2019-07-27 ENCOUNTER — Telehealth: Payer: Self-pay | Admitting: *Deleted

## 2019-07-27 DIAGNOSIS — C61 Malignant neoplasm of prostate: Secondary | ICD-10-CM | POA: Diagnosis not present

## 2019-07-27 DIAGNOSIS — E119 Type 2 diabetes mellitus without complications: Secondary | ICD-10-CM | POA: Diagnosis not present

## 2019-07-27 DIAGNOSIS — E782 Mixed hyperlipidemia: Secondary | ICD-10-CM | POA: Diagnosis not present

## 2019-07-27 DIAGNOSIS — E1165 Type 2 diabetes mellitus with hyperglycemia: Secondary | ICD-10-CM | POA: Diagnosis not present

## 2019-07-27 DIAGNOSIS — D649 Anemia, unspecified: Secondary | ICD-10-CM | POA: Diagnosis not present

## 2019-07-27 DIAGNOSIS — I10 Essential (primary) hypertension: Secondary | ICD-10-CM | POA: Diagnosis not present

## 2019-07-27 LAB — NOVEL CORONAVIRUS, NAA (HOSP ORDER, SEND-OUT TO REF LAB; TAT 18-24 HRS): SARS-CoV-2, NAA: DETECTED — AB

## 2019-07-27 NOTE — Telephone Encounter (Signed)
CALLED PATIENT TO  REMIND OF IMPLANT FOR 07-30-19, SPOKE WITH PATIENT AND HE IS AWARE OF THIS PROCEDURE

## 2019-07-27 NOTE — Progress Notes (Signed)
RESULTS FROM COVID TEST IS POSITIVE.  CALLED AND LVM FOR PAM, OR SCHEDULER FOR PAM , LET HER KNOW AND INQUIRED IF DR BELL HAD CALLED THE PT.  RECEIVED MESSAGE BACK FROM PAM, PER DR BELL RESCHEDULE PT IN A MONTH AND THE DR BELL STATED HE WOULD CALL PT TODAY THIS AFTERNOON.

## 2019-07-28 NOTE — Progress Notes (Signed)
Positive Coivd test results noted.  Md already been contacted.  Patient's surgery has already been rescheduled for 09/07/19.  Per [facility] policy patient does not need to be re-swabbed for procedure on 09/07/19.

## 2019-09-04 ENCOUNTER — Encounter (HOSPITAL_BASED_OUTPATIENT_CLINIC_OR_DEPARTMENT_OTHER): Payer: Self-pay | Admitting: *Deleted

## 2019-09-04 ENCOUNTER — Telehealth: Payer: Self-pay | Admitting: *Deleted

## 2019-09-04 ENCOUNTER — Other Ambulatory Visit: Payer: Self-pay

## 2019-09-04 NOTE — Telephone Encounter (Signed)
Called patient to inform of lab appt. For 09-06-19 @ 9 am, spoke with patient and and he is aware of this appt.

## 2019-09-04 NOTE — Progress Notes (Signed)
Spoke w/ via phone for pre-op interview--- PT Lab needs dos---- None              Lab results------ pt getting cbc,cmp,pt/tt 09-06-2019 @ 0900 (results in epic) Current ekg/ cxr in chart and peic.   COVID test ------  Pt does not need covid test since he had positive covid test 07-26-2019 (per guidelines pt do not get retested 30 days after positive test)  Arrive at ------- 1100 NPO after ------ MN w/ exception clear liquids until 0700 then nothing by mouth (no cream/milk products) Medications to take morning of surgery ----- Allopurinol, Norvasc, Crestor, Toprol w/ sips of water Diabetic medication ----- do not take any diabetic medication morning of surgery  Patient Special Instructions ----- do one fleet enema morning of surgery  Pre-Op special Istructions ----- pt stated that he did not have any symptoms of corona virus when he tested positive or since then  Patient verbalized understanding of instructions that were given at this phone interview. Patient denies shortness of breath, chest pain, fever, cough a this phone interview.

## 2019-09-06 ENCOUNTER — Encounter (HOSPITAL_COMMUNITY)
Admission: RE | Admit: 2019-09-06 | Discharge: 2019-09-06 | Disposition: A | Discharge status: Home / Self Care | Payer: HMO | Source: Ambulatory Visit | Attending: Urology | Admitting: Urology

## 2019-09-06 ENCOUNTER — Other Ambulatory Visit: Payer: Self-pay

## 2019-09-06 ENCOUNTER — Telehealth: Payer: Self-pay | Admitting: *Deleted

## 2019-09-06 DIAGNOSIS — E119 Type 2 diabetes mellitus without complications: Secondary | ICD-10-CM | POA: Insufficient documentation

## 2019-09-06 DIAGNOSIS — C61 Malignant neoplasm of prostate: Secondary | ICD-10-CM | POA: Insufficient documentation

## 2019-09-06 DIAGNOSIS — N529 Male erectile dysfunction, unspecified: Secondary | ICD-10-CM | POA: Diagnosis not present

## 2019-09-06 DIAGNOSIS — Z79899 Other long term (current) drug therapy: Secondary | ICD-10-CM | POA: Insufficient documentation

## 2019-09-06 DIAGNOSIS — I10 Essential (primary) hypertension: Secondary | ICD-10-CM | POA: Diagnosis not present

## 2019-09-06 DIAGNOSIS — E7800 Pure hypercholesterolemia, unspecified: Secondary | ICD-10-CM | POA: Diagnosis not present

## 2019-09-06 DIAGNOSIS — Z01812 Encounter for preprocedural laboratory examination: Secondary | ICD-10-CM | POA: Diagnosis not present

## 2019-09-06 DIAGNOSIS — Z7982 Long term (current) use of aspirin: Secondary | ICD-10-CM | POA: Diagnosis not present

## 2019-09-06 DIAGNOSIS — Z7984 Long term (current) use of oral hypoglycemic drugs: Secondary | ICD-10-CM | POA: Diagnosis not present

## 2019-09-06 DIAGNOSIS — M109 Gout, unspecified: Secondary | ICD-10-CM | POA: Diagnosis not present

## 2019-09-06 DIAGNOSIS — Z888 Allergy status to other drugs, medicaments and biological substances status: Secondary | ICD-10-CM | POA: Diagnosis not present

## 2019-09-06 DIAGNOSIS — E785 Hyperlipidemia, unspecified: Secondary | ICD-10-CM | POA: Diagnosis not present

## 2019-09-06 LAB — COMPREHENSIVE METABOLIC PANEL
ALT: 40 U/L (ref 0–44)
AST: 49 U/L — ABNORMAL HIGH (ref 15–41)
Albumin: 4.1 g/dL (ref 3.5–5.0)
Alkaline Phosphatase: 71 U/L (ref 38–126)
Anion gap: 9 (ref 5–15)
BUN: 29 mg/dL — ABNORMAL HIGH (ref 8–23)
CO2: 19 mmol/L — ABNORMAL LOW (ref 22–32)
Calcium: 9 mg/dL (ref 8.9–10.3)
Chloride: 107 mmol/L (ref 98–111)
Creatinine, Ser: 2.03 mg/dL — ABNORMAL HIGH (ref 0.61–1.24)
GFR calc Af Amer: 38 mL/min — ABNORMAL LOW (ref >60)
GFR calc non Af Amer: 33 mL/min — ABNORMAL LOW (ref >60)
Glucose, Bld: 111 mg/dL — ABNORMAL HIGH (ref 70–99)
Potassium: 4.7 mmol/L (ref 3.5–5.1)
Sodium: 135 mmol/L (ref 135–145)
Total Bilirubin: 0.3 mg/dL (ref 0.3–1.2)
Total Protein: 8.1 g/dL (ref 6.5–8.1)

## 2019-09-06 LAB — CBC
HCT: 32.3 % — ABNORMAL LOW (ref 39.0–52.0)
Hemoglobin: 10 g/dL — ABNORMAL LOW (ref 13.0–17.0)
MCH: 30.3 pg (ref 26.0–34.0)
MCHC: 31 g/dL (ref 30.0–36.0)
MCV: 97.9 fL (ref 80.0–100.0)
Platelets: 170 10*3/uL (ref 150–400)
RBC: 3.3 MIL/uL — ABNORMAL LOW (ref 4.22–5.81)
RDW: 14 % (ref 11.5–15.5)
WBC: 7.9 10*3/uL (ref 4.0–10.5)
nRBC: 0 % (ref 0.0–0.2)

## 2019-09-06 LAB — PROTIME-INR
INR: 1.1 (ref 0.8–1.2)
Prothrombin Time: 13.6 seconds (ref 11.4–15.2)

## 2019-09-06 LAB — APTT: aPTT: 35 seconds (ref 24–36)

## 2019-09-06 NOTE — Telephone Encounter (Signed)
Called patient to remind of procedure for 09-07-19, spoke with patient and he is aware of this procedure

## 2019-09-07 ENCOUNTER — Other Ambulatory Visit: Payer: Self-pay

## 2019-09-07 ENCOUNTER — Encounter (HOSPITAL_BASED_OUTPATIENT_CLINIC_OR_DEPARTMENT_OTHER)
Admission: RE | Disposition: A | Discharge status: Home / Self Care | Payer: Self-pay | Source: Home / Self Care | Attending: Urology

## 2019-09-07 ENCOUNTER — Ambulatory Visit (HOSPITAL_BASED_OUTPATIENT_CLINIC_OR_DEPARTMENT_OTHER): Payer: HMO | Admitting: Anesthesiology

## 2019-09-07 ENCOUNTER — Encounter (HOSPITAL_BASED_OUTPATIENT_CLINIC_OR_DEPARTMENT_OTHER): Payer: Self-pay

## 2019-09-07 ENCOUNTER — Ambulatory Visit (HOSPITAL_BASED_OUTPATIENT_CLINIC_OR_DEPARTMENT_OTHER)
Admission: RE | Admit: 2019-09-07 | Discharge: 2019-09-07 | Disposition: A | Discharge status: Home / Self Care | Payer: HMO | Attending: Urology | Admitting: Urology

## 2019-09-07 ENCOUNTER — Ambulatory Visit (HOSPITAL_COMMUNITY): Payer: HMO

## 2019-09-07 DIAGNOSIS — N189 Chronic kidney disease, unspecified: Secondary | ICD-10-CM | POA: Diagnosis not present

## 2019-09-07 DIAGNOSIS — M109 Gout, unspecified: Secondary | ICD-10-CM | POA: Diagnosis not present

## 2019-09-07 DIAGNOSIS — C61 Malignant neoplasm of prostate: Secondary | ICD-10-CM | POA: Insufficient documentation

## 2019-09-07 DIAGNOSIS — E7800 Pure hypercholesterolemia, unspecified: Secondary | ICD-10-CM | POA: Diagnosis not present

## 2019-09-07 DIAGNOSIS — I10 Essential (primary) hypertension: Secondary | ICD-10-CM | POA: Insufficient documentation

## 2019-09-07 DIAGNOSIS — Z888 Allergy status to other drugs, medicaments and biological substances status: Secondary | ICD-10-CM | POA: Diagnosis not present

## 2019-09-07 DIAGNOSIS — Z7982 Long term (current) use of aspirin: Secondary | ICD-10-CM | POA: Insufficient documentation

## 2019-09-07 DIAGNOSIS — I129 Hypertensive chronic kidney disease with stage 1 through stage 4 chronic kidney disease, or unspecified chronic kidney disease: Secondary | ICD-10-CM | POA: Diagnosis not present

## 2019-09-07 DIAGNOSIS — Z79899 Other long term (current) drug therapy: Secondary | ICD-10-CM | POA: Insufficient documentation

## 2019-09-07 DIAGNOSIS — E1122 Type 2 diabetes mellitus with diabetic chronic kidney disease: Secondary | ICD-10-CM | POA: Diagnosis not present

## 2019-09-07 DIAGNOSIS — E785 Hyperlipidemia, unspecified: Secondary | ICD-10-CM | POA: Diagnosis not present

## 2019-09-07 DIAGNOSIS — N529 Male erectile dysfunction, unspecified: Secondary | ICD-10-CM | POA: Diagnosis not present

## 2019-09-07 DIAGNOSIS — Z7984 Long term (current) use of oral hypoglycemic drugs: Secondary | ICD-10-CM | POA: Insufficient documentation

## 2019-09-07 HISTORY — DX: Type 2 diabetes mellitus without complications: E11.9

## 2019-09-07 HISTORY — PX: SPACE OAR INSTILLATION: SHX6769

## 2019-09-07 HISTORY — PX: RADIOACTIVE SEED IMPLANT: SHX5150

## 2019-09-07 LAB — GLUCOSE, CAPILLARY
Glucose-Capillary: 119 mg/dL — ABNORMAL HIGH (ref 70–99)
Glucose-Capillary: 122 mg/dL — ABNORMAL HIGH (ref 70–99)

## 2019-09-07 SURGERY — INSERTION, RADIATION SOURCE, PROSTATE
Anesthesia: General | Site: Rectum

## 2019-09-07 MED ORDER — KETOROLAC TROMETHAMINE 30 MG/ML IJ SOLN
INTRAMUSCULAR | Status: AC
  Filled 2019-09-07: qty 1

## 2019-09-07 MED ORDER — SODIUM CHLORIDE FLUSH 0.9 % IV SOLN
INTRAVENOUS | Status: DC | PRN
  Administered 2019-09-07: 10 mL

## 2019-09-07 MED ORDER — FENTANYL CITRATE (PF) 100 MCG/2ML IJ SOLN
INTRAMUSCULAR | Status: DC | PRN
  Administered 2019-09-07 (×4): 25 ug via INTRAVENOUS

## 2019-09-07 MED ORDER — STERILE WATER FOR IRRIGATION IR SOLN
Status: DC | PRN
  Administered 2019-09-07: 500 mL

## 2019-09-07 MED ORDER — PROPOFOL 10 MG/ML IV BOLUS
INTRAVENOUS | Status: DC | PRN
  Administered 2019-09-07: 200 mg via INTRAVENOUS

## 2019-09-07 MED ORDER — ACETAMINOPHEN 500 MG PO TABS
1000.0000 mg | ORAL_TABLET | Freq: Once | ORAL | Status: AC
  Administered 2019-09-07: 12:00:00 1000 mg via ORAL
  Filled 2019-09-07: qty 2

## 2019-09-07 MED ORDER — LIDOCAINE 2% (20 MG/ML) 5 ML SYRINGE
INTRAMUSCULAR | Status: DC | PRN
  Administered 2019-09-07: 60 mg via INTRAVENOUS

## 2019-09-07 MED ORDER — CIPROFLOXACIN IN D5W 400 MG/200ML IV SOLN
400.0000 mg | INTRAVENOUS | Status: AC
  Administered 2019-09-07: 13:00:00 400 mg via INTRAVENOUS
  Filled 2019-09-07: qty 200

## 2019-09-07 MED ORDER — FENTANYL CITRATE (PF) 100 MCG/2ML IJ SOLN
INTRAMUSCULAR | Status: AC
  Filled 2019-09-07: qty 2

## 2019-09-07 MED ORDER — LIDOCAINE 2% (20 MG/ML) 5 ML SYRINGE
INTRAMUSCULAR | Status: AC
  Filled 2019-09-07: qty 5

## 2019-09-07 MED ORDER — HYDROCODONE-ACETAMINOPHEN 5-325 MG PO TABS: 1.0000 | ORAL_TABLET | ORAL | 0 refills | Status: DC | PRN

## 2019-09-07 MED ORDER — LACTATED RINGERS IV SOLN
INTRAVENOUS | Status: DC
  Administered 2019-09-07: 12:00:00 50 mL/h via INTRAVENOUS
  Filled 2019-09-07: qty 1000

## 2019-09-07 MED ORDER — DEXAMETHASONE SODIUM PHOSPHATE 10 MG/ML IJ SOLN
INTRAMUSCULAR | Status: DC | PRN
  Administered 2019-09-07: 5 mg via INTRAVENOUS

## 2019-09-07 MED ORDER — FENTANYL CITRATE (PF) 100 MCG/2ML IJ SOLN
25.0000 ug | INTRAMUSCULAR | Status: DC | PRN
  Filled 2019-09-07: qty 1

## 2019-09-07 MED ORDER — KETOROLAC TROMETHAMINE 30 MG/ML IJ SOLN
INTRAMUSCULAR | Status: DC | PRN
  Administered 2019-09-07: 30 mg via INTRAVENOUS

## 2019-09-07 MED ORDER — IOHEXOL 300 MG/ML  SOLN
INTRAMUSCULAR | Status: DC | PRN
  Administered 2019-09-07: 3 mL

## 2019-09-07 MED ORDER — SODIUM CHLORIDE 0.9 % IV SOLN
INTRAVENOUS | Status: AC | PRN
  Administered 2019-09-07: 1000 mL

## 2019-09-07 MED ORDER — PHENYLEPHRINE HCL (PRESSORS) 10 MG/ML IV SOLN
INTRAVENOUS | Status: DC | PRN
  Administered 2019-09-07: 80 ug via INTRAVENOUS

## 2019-09-07 MED ORDER — SODIUM CHLORIDE (PF) 0.9 % IJ SOLN
INTRAMUSCULAR | Status: AC
  Filled 2019-09-07: qty 50

## 2019-09-07 MED ORDER — FLEET ENEMA 7-19 GM/118ML RE ENEM
1.0000 | ENEMA | Freq: Once | RECTAL | Status: DC
  Filled 2019-09-07: qty 1

## 2019-09-07 MED ORDER — ONDANSETRON HCL 4 MG/2ML IJ SOLN
INTRAMUSCULAR | Status: AC
  Filled 2019-09-07: qty 2

## 2019-09-07 MED ORDER — MIDAZOLAM HCL 2 MG/2ML IJ SOLN
INTRAMUSCULAR | Status: AC
  Filled 2019-09-07: qty 2

## 2019-09-07 MED ORDER — DEXAMETHASONE SODIUM PHOSPHATE 10 MG/ML IJ SOLN
INTRAMUSCULAR | Status: AC
  Filled 2019-09-07: qty 1

## 2019-09-07 MED ORDER — ONDANSETRON HCL 4 MG/2ML IJ SOLN
INTRAMUSCULAR | Status: DC | PRN
  Administered 2019-09-07: 4 mg via INTRAVENOUS

## 2019-09-07 MED ORDER — CIPROFLOXACIN IN D5W 400 MG/200ML IV SOLN
INTRAVENOUS | Status: AC
  Filled 2019-09-07: qty 200

## 2019-09-07 MED ORDER — ACETAMINOPHEN 500 MG PO TABS
ORAL_TABLET | ORAL | Status: AC
  Filled 2019-09-07: qty 2

## 2019-09-07 MED ORDER — PHENYLEPHRINE 40 MCG/ML (10ML) SYRINGE FOR IV PUSH (FOR BLOOD PRESSURE SUPPORT)
PREFILLED_SYRINGE | INTRAVENOUS | Status: AC
  Filled 2019-09-07: qty 10

## 2019-09-07 MED ORDER — KETOROLAC TROMETHAMINE 15 MG/ML IJ SOLN
15.0000 mg | Freq: Once | INTRAMUSCULAR | Status: DC | PRN
  Filled 2019-09-07: qty 1

## 2019-09-07 MED ORDER — MIDAZOLAM HCL 2 MG/2ML IJ SOLN
INTRAMUSCULAR | Status: DC | PRN
  Administered 2019-09-07: 2 mg via INTRAVENOUS

## 2019-09-07 MED ORDER — ONDANSETRON HCL 4 MG/2ML IJ SOLN
4.0000 mg | Freq: Once | INTRAMUSCULAR | Status: DC | PRN
  Filled 2019-09-07: qty 2

## 2019-09-07 SURGICAL SUPPLY — 34 items
BAG URINE DRAIN 2000ML AR STRL (UROLOGICAL SUPPLIES) ×3 IMPLANT
BLADE CLIPPER SENSICLIP SURGIC (BLADE) ×3 IMPLANT
CATH FOLEY 2WAY SLVR  5CC 16FR (CATHETERS) ×1
CATH FOLEY 2WAY SLVR 5CC 16FR (CATHETERS) ×2 IMPLANT
CATH ROBINSON RED A/P 16FR (CATHETERS) IMPLANT
CATH ROBINSON RED A/P 20FR (CATHETERS) ×3 IMPLANT
CLOTH BEACON ORANGE TIMEOUT ST (SAFETY) ×3 IMPLANT
CONT SPEC 4OZ CLIKSEAL STRL BL (MISCELLANEOUS) ×6 IMPLANT
COVER BACK TABLE 60X90IN (DRAPES) ×3 IMPLANT
COVER MAYO STAND STRL (DRAPES) ×3 IMPLANT
DRSG TEGADERM 4X4.75 (GAUZE/BANDAGES/DRESSINGS) ×3 IMPLANT
DRSG TEGADERM 8X12 (GAUZE/BANDAGES/DRESSINGS) ×6 IMPLANT
GAUZE SPONGE 4X4 12PLY STRL (GAUZE/BANDAGES/DRESSINGS) ×3 IMPLANT
GLOVE BIO SURGEON STRL SZ7.5 (GLOVE) ×3 IMPLANT
GLOVE BIO SURGEON STRL SZ8 (GLOVE) IMPLANT
GLOVE SURG ORTHO 8.5 STRL (GLOVE) ×3 IMPLANT
GLOVE SURG SS PI 6.5 STRL IVOR (GLOVE) IMPLANT
GOWN STRL REUS W/TWL LRG LVL3 (GOWN DISPOSABLE) ×3 IMPLANT
GOWN STRL REUS W/TWL XL LVL3 (GOWN DISPOSABLE) ×3 IMPLANT
HOLDER FOLEY CATH W/STRAP (MISCELLANEOUS) IMPLANT
I-Seed AgX100 ×198 IMPLANT
IMPL SPACEOAR SYSTEM 10ML (Spacer) ×2 IMPLANT
IMPLANT SPACEOAR SYSTEM 10ML (Spacer) ×3 IMPLANT
IV NS 1000ML (IV SOLUTION) ×1
IV NS 1000ML BAXH (IV SOLUTION) ×2 IMPLANT
KIT TURNOVER CYSTO (KITS) ×3 IMPLANT
MARKER SKIN DUAL TIP RULER LAB (MISCELLANEOUS) ×3 IMPLANT
PACK CYSTO (CUSTOM PROCEDURE TRAY) ×3 IMPLANT
SURGILUBE 2OZ TUBE FLIPTOP (MISCELLANEOUS) ×3 IMPLANT
SUT BONE WAX W31G (SUTURE) IMPLANT
SYR 10ML LL (SYRINGE) ×3 IMPLANT
TOWEL OR 17X26 10 PK STRL BLUE (TOWEL DISPOSABLE) ×3 IMPLANT
UNDERPAD 30X30 (UNDERPADS AND DIAPERS) ×6 IMPLANT
WATER STERILE IRR 500ML POUR (IV SOLUTION) ×3 IMPLANT

## 2019-09-07 NOTE — Op Note (Signed)
PATIENT:  Jorge Decker  PRE-OPERATIVE DIAGNOSIS:  Adenocarcinoma of the prostate  POST-OPERATIVE DIAGNOSIS:  Same  PROCEDURE:  1. I-125 radioactive seed implantation 2. Cystoscopy  3. Placement of SpaceOAR  SURGEON:  Surgeon(s): Wendie Simmer, MD  Radiation oncologist: Dr. Tyler Pita  ANESTHESIA:  General  EBL:  Minimal  DRAINS: 41 French Foley catheter  INDICATION: Jorge Decker  Description of procedure: After informed consent the patient was brought to the major OR, placed on the table and administered general anesthesia. He was then moved to the modified lithotomy position with his perineum perpendicular to the floor. His perineum and genitalia were then sterilely prepped. An official timeout was then performed. A 16 French Foley catheter was then placed in the bladder and filled with dilute contrast, a rectal tube was placed in the rectum and the transrectal ultrasound probe was placed in the rectum and affixed to the stand. He was then sterilely draped.  Real time ultrasonography was used along with the seed planning software [employer]state. This was used to develop the seed plan including the number of needles as well as number of seeds required for complete and adequate coverage. Real-time ultrasonography was then used along with the previously developed plan and the Nucletron device to implant a total of 66 seeds using 24 needles. This proceeded without difficulty or complication.   I then proceeded with placement of SpaceOARby introducing a needle with the bevel angled inferiorly approximately 2 cm superior to the anus. This was angled downward and under direct ultrasound was placed within the space between the prostatic capsule and rectum. This was confirmed with a small amount of sterile saline injected and this was performed under direct ultrasound. I then attached the SpaceOARto the needle and injected this in the space between the prostate and rectum with  good placement noted.  A Foley catheter was then removed as well as the transrectal ultrasound probe and rectal probe. Flexible cystoscopy was then performed using the 17 French flexible scope which revealed a normal urethra throughout its length down to the sphincter which appeared intact. The prostatic urethra revealed bilobar hypertrophy but no evidence of obstruction, seeds, spacers or lesions. The bladder was then entered and fully and systematically inspected. The ureteral orifices were noted to be of normal configuration and position. The mucosa revealed no evidence of tumors. There were also no stones identified within the bladder. I noted no seeds or spacers on the floor of the bladder and retroflexion of the scope revealed no seeds protruding from the base of the prostate.  The cystoscope was then removed and the patient was awakened and taken to recovery room in stable and satisfactory condition. He tolerated procedure well and there were no intraoperative complications.

## 2019-09-07 NOTE — Anesthesia Procedure Notes (Signed)
Procedure Name: LMA Insertion Date/Time: 09/07/2019 1:19 PM Performed by: Wanita Chamberlain, CRNA Pre-anesthesia Checklist: Patient identified, Emergency Drugs available, Suction available and Patient being monitored Patient Re-evaluated:Patient Re-evaluated prior to induction Oxygen Delivery Method: Circle system utilized Preoxygenation: Pre-oxygenation with 100% oxygen Induction Type: IV induction Ventilation: Mask ventilation without difficulty LMA: LMA with gastric port inserted LMA Size: 5.0 Number of attempts: 1 Placement Confirmation: breath sounds checked- equal and bilateral,  CO2 detector and positive ETCO2 Tube secured with: Tape Dental Injury: Teeth and Oropharynx as per pre-operative assessment

## 2019-09-07 NOTE — Anesthesia Preprocedure Evaluation (Addendum)
Anesthesia Evaluation  Patient identified by MRN, date of birth, ID band Patient awake    Reviewed: Allergy & Precautions, NPO status , Patient's Chart, lab work & pertinent test results, reviewed documented beta blocker date and time   Airway Mallampati: II  TM Distance: >3 FB Neck ROM: Full    Dental  (+) Edentulous Upper, Edentulous Lower   Pulmonary neg pulmonary ROS,    Pulmonary exam normal breath sounds clear to auscultation       Cardiovascular hypertension, Pt. on medications and Pt. on home beta blockers Normal cardiovascular exam Rhythm:Regular Rate:Normal  ECG: rate 80. Sinus rhythm with marked sinus arrhythmia   Neuro/Psych negative neurological ROS  negative psych ROS   GI/Hepatic negative GI ROS, Neg liver ROS,   Endo/Other  diabetes, Oral Hypoglycemic Agents  Renal/GU CRFRenal disease     Musculoskeletal Gout   Abdominal (+) + obese,   Peds  Hematology  (+) anemia , HLD   Anesthesia Other Findings PROSTATE CANCER  Reproductive/Obstetrics                            Anesthesia Physical Anesthesia Plan  ASA: III  Anesthesia Plan: General   Post-op Pain Management:    Induction: Intravenous  PONV Risk Score and Plan: 3 and Midazolam, Dexamethasone, Ondansetron and Treatment may vary due to age or medical condition  Airway Management Planned: LMA  Additional Equipment:   Intra-op Plan:   Post-operative Plan: Extubation in OR  Informed Consent: I have reviewed the patients History and Physical, chart, labs and discussed the procedure including the risks, benefits and alternatives for the proposed anesthesia with the patient or authorized representative who has indicated his/her understanding and acceptance.     Dental advisory given  Plan Discussed with: CRNA  Anesthesia Plan Comments:        Anesthesia Quick Evaluation

## 2019-09-07 NOTE — Discharge Instructions (Addendum)
Antibiotics °You may be given a prescription for an antibiotic to take when you arrive home. If so, be sure to take every tablet in the bottle, even if you are feeling better before the prescription is finished. If you begin itching, notice a rash or start to swell on your trunk, arms, legs and/or throat, immediately stop taking the antibiotic and call your Urologist. °Diet °Resume your usual diet when you return home. To keep your bowels moving easily and softly, drink prune, apple and cranberry juice at room temperature. You may also take a stool softener, such as Colace, which is available without prescription at local pharmacies. °Daily activities °? No driving or heavy lifting for at least two days after the implant. °? No bike riding, horseback riding or riding lawn mowers for the first month after the implant. °? Any strenuous physical activity should be approved by your doctor before you resume it. °Sexual relations °You may resume sexual relations two weeks after the procedure. A condom should be used for the first two weeks. Your semen may be dark brown or black; this is normal and is related bleeding that may have occurred during the implant. °Postoperative swelling °Expect swelling and bruising of the scrotum and perineum (the area between the scrotum and anus). Both the swelling and the bruising should resolve in l or 2 weeks. Ice packs and over- the-counter medications such as Tylenol, Advil or Aleve may lessen your discomfort. °Postoperative urination °Most men experience burning on urination and/or urinary frequency. If this becomes bothersome, contact your Urologist.  Medication can be prescribed to relieve these problems.  It is normal to have some blood in your urine for a few days after the implant. °Special instructions related to the seeds °It is unlikely that you will pass an Iodine-125 seed in your urine. The seeds are silver in color and are about as large as a grain of rice. If you pass a  seed, do not handle it with your fingers. Use a spoon to place it in an envelope or jar in place this in base occluded area such as the garage or basement for return to the radiation clinic at your convenience. ° °Contact your doctor for °? Temperature greater than 101 F °? Increasing pain °? Inability to urinate °Follow-up ° You should have follow up with your urologist and radiation oncologist about 3 weeks after the procedure. °General information regarding Iodine seeds °? Iodine-125 is a low energy radioactive material. It is not deeply penetrating and loses energy at short distances. Your prostate will absorb the radiation. Objects that are touched or used by the patient do not become radioactive. °? Body wastes (urine and stool) or body fluids (saliva, tears, semen or blood) are not radioactive. °? The Nuclear Regulatory Commission (NRC) has determined that no radiation precautions are needed for patients undergoing Iodine-125 seed implantation. The NRC states that such patients do not present a risk to the people around them, including young children and pregnant women. However, in keeping with the general principle that radiation exposure should be kept as low reasonably possible, we suggest the following: °? Children and pets should not sit on the patient's lap for the first two (2) weeks after the implant. °? Pregnant (or possibly pregnant) women should avoid prolonged, close contact with the patient for the first two (2) weeks after the implant. °? A distance of three (3) feet is acceptable. °? At a distance of three (3) feet, there is no limit to the   length of time anyone can be with the patient.   NO ADVIL, ALEVE, MOTRIN, IBUPROFEN UNTIL830 PM THIS PM   Post Anesthesia Home Care Instructions   Activity: Get plenty of rest for the remainder of the day. A responsible individual must stay with you for 24 hours following the procedure.  For the next 24 hours, DO NOT: -Drive a car -Conservation officer, nature -Drink alcoholic beverages -Take any medication unless instructed by your physician -Make any legal decisions or sign important papers.  Meals: Start with liquid foods such as gelatin or soup. Progress to regular foods as tolerated. Avoid greasy, spicy, heavy foods. If nausea and/or vomiting occur, drink only clear liquids until the nausea and/or vomiting subsides. Call your physician if vomiting continues.  Special Instructions/Symptoms: Your throat may feel dry or sore from the anesthesia or the breathing tube placed in your throat during surgery. If this causes discomfort, gargle with warm salt water. The discomfort should disappear within 24 hours.

## 2019-09-07 NOTE — H&P (Signed)
CC/HPI: Cc: Elevated PSA.  HPI:  04/04/2019  Patient is a referral for elevated PSA. PSA was 4.65 on 12/21/2018. It was 5.41. It was rechecked on 01/18/2019. He does not have a family history of prostate cancer. He denies a history of UTI or prostatitis. He has nocturia 0-1 times a night, but otherwise has no voiding complaints. He does have some erectile dysfunction. He has used sildenafil in the past with moderate success. He wants to try Cialis.   05/23/2019  Patient presents today for prostate biopsy.   05/30/2019  . Patient been doing well since prostate biopsy. Unfortunately, biopsy revealed 5 out of 12 cores positive for prostate cancer, all on the right, maximum Gleason score 3+4. PSA is 5.41. No palpable abnormality on digital rectal exam   BMI 31, protuberant abdomen, but no abdominal surgeries  Mild erectile dysfunction, responsive to Cialis  Minimal voiding complaints   07/04/2019  Patient met with Dr. Tammi Klippel. He is elected to proceed with brachytherapy with Spaceoar. He did have a chest x-ray performed which revealed nipple shadows and it was recommended that he have a repeat chest x-ray with nipple markers.     ALLERGIES: lisinopril    MEDICATIONS: Allopurinol 300 mg tablet  Metformin Hcl 1,000 mg tablet  Metoprolol Succinate 50 mg tablet, extended release 24 hr  Amlodipine Besylate 5 mg tablet  Aspir 81  Indomethacin  Rosuvastatin Calcium 5 mg tablet  Sildenafil Citrate 20 mg tablet  Valsartan-Hydrochlorothiazide 160 mg-25 mg tablet  Vitamin B12  Vitamin D3     GU PSH: Prostate Needle Biopsy - 05/23/2019     NON-GU PSH: Surgical Pathology, Gross And Microscopic Examination For Prostate Needle - 05/23/2019     GU PMH: Prostate Cancer - 05/30/2019 BPH w/o LUTS - 04/04/2019 ED due to arterial insufficiency - 04/04/2019 Elevated PSA - 04/04/2019    NON-GU PMH: Diabetes Type 2 Gout Hypercholesterolemia Hypertension    FAMILY HISTORY: 1 Daughter - Other 5 sons -  Other   SOCIAL HISTORY: Marital Status: Single Preferred Language: English; Race: Black or African American Current Smoking Status: Patient has never smoked.   Tobacco Use Assessment Completed: Used Tobacco in last 30 days? Drinks 1 caffeinated drink per day.    REVIEW OF SYSTEMS:    GU Review Male:   Patient denies frequent urination, hard to postpone urination, burning/ pain with urination, get up at night to urinate, leakage of urine, stream starts and stops, trouble starting your stream, have to strain to urinate , erection problems, and penile pain.  Gastrointestinal (Upper):   Patient denies nausea, vomiting, and indigestion/ heartburn.  Gastrointestinal (Lower):   Patient denies diarrhea and constipation.  Constitutional:   Patient denies fever, night sweats, weight loss, and fatigue.  Skin:   Patient denies skin rash/ lesion and itching.  Eyes:   Patient denies blurred vision and double vision.  Ears/ Nose/ Throat:   Patient denies sore throat and sinus problems.  Hematologic/Lymphatic:   Patient denies swollen glands and easy bruising.  Cardiovascular:   Patient denies leg swelling and chest pains.  Respiratory:   Patient denies cough and shortness of breath.  Endocrine:   Patient denies excessive thirst.  Musculoskeletal:   Patient denies back pain and joint pain.  Neurological:   Patient denies dizziness and headaches.  Psychologic:   Patient denies depression and anxiety.   VITAL SIGNS:      07/04/2019 03:06 PM  Temperature 97.8 F / 36.5 C   MULTI-SYSTEM PHYSICAL EXAMINATION:  Constitutional: Well-nourished. No physical deformities. Normally developed. Good grooming.  Respiratory: No labored breathing, no use of accessory muscles.   Cardiovascular: Normal temperature, adequate perfusion of extremities  Skin: No paleness, no jaundice  Neurologic / Psychiatric: Oriented to time, oriented to place, oriented to person. No depression, no anxiety, no agitation.   Gastrointestinal: No mass, no tenderness, no rigidity, obese abdomen. Protruding Abdomen no abdominal scars  Eyes: Normal conjunctivae. Normal eyelids.  Musculoskeletal: Normal gait and station of head and neck.     PAST DATA REVIEWED:  Source Of History:  Patient  Records Review:   Previous Doctor Records, Previous Patient Records   PROCEDURES:          Urinalysis - 81003 Dipstick Dipstick Cont'd  Specimen: Voided Bilirubin: Neg  Color: Yellow Ketones: Neg  Appearance: Clear Blood: Neg  Specific Gravity: 1.015 Protein: Neg  pH: 5.0 Urobilinogen: 0.2  Glucose: Neg Nitrites: Neg    Leukocyte Esterase: Neg         Urinalysis - 81003 Dipstick Dipstick Cont'd  Color: Yellow Bilirubin: Neg mg/dL  Appearance: Clear Ketones: Neg mg/dL  Specific Gravity: 1.015 Blood: Neg ery/uL  pH: <=5.0 Protein: Neg mg/dL  Glucose: Neg mg/dL Urobilinogen: 0.2 mg/dL    Nitrites: Neg    Leukocyte Esterase: Neg leu/uL    ASSESSMENT:      ICD-10 Details  1 GU:   Prostate Cancer - C61    PLAN:           Orders Labs Urine Culture          Schedule X-Rays: 1 Week - Chest X-Ray Outside Without Contrast - With nipple markers to rule out chest lesion  Return Visit/Planned Activity: 1 Week - Chest X-Ray Outside.          Document Letter(s):  Created for Patient: Clinical Summary         Notes:   Obtain chest x-ray with nipple markers to rule Out chest/lung lesion   Proceed with brachytherapy with the use of spaceoar   cc: Dr Inda Merlin

## 2019-09-07 NOTE — Transfer of Care (Signed)
Immediate Anesthesia Transfer of Care Note  Patient: [place]  Procedure(s) Performed: RADIOACTIVE SEED IMPLANT/BRACHYTHERAPY IMPLANT (N/A Prostate) SPACE OAR INSTILLATION (N/A Rectum)  Patient Location: PACU  Anesthesia Type:General  Level of Consciousness: awake, alert , oriented and patient cooperative  Airway & Oxygen Therapy: Patient Spontanous Breathing and Patient connected to nasal cannula oxygen  Post-op Assessment: Report given to RN and Post -op Vital signs reviewed and stable  Post vital signs: Reviewed and stable  Last Vitals:  Vitals Value Taken Time  BP    Temp    Pulse 77 09/07/19 1446  Resp 12 09/07/19 1446  SpO2 97 % 09/07/19 1446  Vitals shown include unvalidated device data.  Last Pain:  Vitals:   09/07/19 1139  TempSrc: Oral  PainSc: 0-No pain      Patients Stated Pain Goal: 4 ([id number])  Complications: No apparent anesthesia complications

## 2019-09-08 NOTE — Anesthesia Postprocedure Evaluation (Signed)
Anesthesia Post Note  Patient: [place]  Procedure(s) Performed: RADIOACTIVE SEED IMPLANT/BRACHYTHERAPY IMPLANT (N/A Prostate) SPACE OAR INSTILLATION (N/A Rectum)     Patient location during evaluation: PACU Anesthesia Type: General Level of consciousness: awake and alert Pain management: pain level controlled Vital Signs Assessment: post-procedure vital signs reviewed and stable Respiratory status: spontaneous breathing, nonlabored ventilation, respiratory function stable and patient connected to nasal cannula oxygen Cardiovascular status: blood pressure returned to baseline and stable Postop Assessment: no apparent nausea or vomiting Anesthetic complications: no    Last Vitals:  Vitals:   09/07/19 1530 09/07/19 1630  BP: 107/84 127/87  Pulse: 62 72  Resp: 17 16  Temp:  (!) 36.4 C  SpO2: 93% 97%    Last Pain:  Vitals:   09/07/19 1615  TempSrc:   PainSc: 0-No pain                 Ryan P Ellender

## 2019-09-08 NOTE — Progress Notes (Signed)
  Radiation Oncology         [phone] ________________________________  Name: Acxel Tumminia Holleman MRN: [id number]  Date: 09/08/2019  DOB: May 12, 1951       Prostate Seed Implant  [id number], Herbie [place], MD  No ref. provider found  DIAGNOSIS: 68 y.o. gentleman with Stage T1c adenocarcinoma of the prostate with Gleason score of 3+4, and PSA of 5.41.    ICD-10-CM   1. Prostate cancer ([place])  C61 DG Chest 2 View    DG Chest 2 View    PROCEDURE: Insertion of radioactive I-125 seeds into the prostate gland.  RADIATION DOSE: 145 Gy, definitive therapy.  TECHNIQUE: [place] was brought to the operating room with the urologist. He was placed in the dorsolithotomy position. He was catheterized and a rectal tube was inserted. The perineum was shaved, prepped and draped. The ultrasound probe was then introduced into the rectum to see the prostate gland.  TREATMENT DEVICE: A needle grid was attached to the ultrasound probe stand and anchor needles were placed.  3D PLANNING: The prostate was imaged in 3D using a sagittal sweep of the prostate probe. These images were transferred to the planning computer. There, the prostate, urethra and rectum were defined on each axial reconstructed image. Then, the software created an optimized 3D plan and a few seed positions were adjusted. The quality of the plan was reviewed using [facility] information for the target and the following two organs at risk:  Urethra and Rectum.  Then the accepted plan was printed and handed off to the radiation therapist.  Under my supervision, the custom loading of the seeds and spacers was carried out and loaded into sealed vicryl sleeves.  These pre-loaded needles were then placed into the needle holder.Marland Kitchen  PROSTATE VOLUME STUDY:  Using transrectal ultrasound the volume of the prostate was verified to be 50.9 cc.  SPECIAL TREATMENT PROCEDURE/SUPERVISION AND HANDLING: The pre-loaded needles were then delivered under sagittal guidance. A  total of 24 needles were used to deposit 66 seeds in the prostate gland. The individual seed activity was 0.570 mCi.  SpaceOAR:  Yes  COMPLEX SIMULATION: At the end of the procedure, an anterior radiograph of the pelvis was obtained to document seed positioning and count. Cystoscopy was performed to check the urethra and bladder.  MICRODOSIMETRY: At the end of the procedure, the patient was emitting 0.101 mR/hr at 1 meter. Accordingly, he was considered safe for hospital discharge.  PLAN: The patient will return to the radiation oncology clinic for post implant CT dosimetry in three weeks.   ________________________________  Sheral Apley Tammi Klippel, M.D.

## 2019-09-10 ENCOUNTER — Encounter (HOSPITAL_BASED_OUTPATIENT_CLINIC_OR_DEPARTMENT_OTHER): Payer: Self-pay | Admitting: Urology

## 2019-09-12 ENCOUNTER — Ambulatory Visit: Payer: Self-pay

## 2019-09-25 ENCOUNTER — Telehealth: Payer: Self-pay | Admitting: *Deleted

## 2019-09-25 NOTE — Telephone Encounter (Signed)
CALLED PATIENT TO REMIND OF POST SEED APPTS. AND MRI FOR 09-26-19, SPOKE WITH PATIENT AND HE IS AWARE OF THESE APPTS.

## 2019-09-26 ENCOUNTER — Other Ambulatory Visit: Payer: Self-pay

## 2019-09-26 ENCOUNTER — Ambulatory Visit
Admission: RE | Admit: 2019-09-26 | Discharge: 2019-09-26 | Disposition: A | Discharge status: Home / Self Care | Payer: HMO | Source: Ambulatory Visit | Attending: Urology | Admitting: Urology

## 2019-09-26 ENCOUNTER — Encounter: Payer: Self-pay | Admitting: Urology

## 2019-09-26 ENCOUNTER — Ambulatory Visit (HOSPITAL_COMMUNITY)
Admission: RE | Admit: 2019-09-26 | Discharge: 2019-09-26 | Disposition: A | Discharge status: Home / Self Care | Payer: HMO | Source: Ambulatory Visit | Attending: Urology | Admitting: Urology

## 2019-09-26 VITALS — BP 138/95 | HR 90 | Resp 20 | Wt 189.0 lb

## 2019-09-26 DIAGNOSIS — C61 Malignant neoplasm of prostate: Secondary | ICD-10-CM

## 2019-09-26 MED ORDER — SILODOSIN 8 MG PO CAPS: 8.0000 mg | ORAL_CAPSULE | Freq: Every day | ORAL | 2 refills | Status: DC

## 2019-09-26 NOTE — Progress Notes (Signed)
Radiation Oncology         [phone] ________________________________  Name: Jorge Decker MRN: [id number]  Date: 09/26/2019  DOB: [birth year]  Post-Seed Follow-Up Visit Note  CC: [name], MD  [name], MD  Diagnosis:   68 y.o. gentleman with Stage T1c adenocarcinoma of the prostate with Gleason score of 3+4, and PSA of 5.41.    ICD-10-CM   1. Malignant neoplasm of prostate ([place])  C61     Interval Since Last Radiation:  2.5 weeks 09/07/19:  Insertion of radioactive I-125 seeds into the prostate gland; 145 Gy, definitive/boost therapy with placement of SpaceOAR gel.  Narrative:  The patient returns today for routine follow-up.  He is complaining of increased urinary frequency and urinary hesitation symptoms. He filled out a questionnaire regarding urinary function today providing and overall IPSS score of 14 characterizing his symptoms as moderate with increased frequency, urgency, nocturia x4/night and weak stream.  He denies dysuria, gross hematuria, straining to void, incomplete emptying or incontinence.  His pre-implant score was 2. He denies abdominal pain or any bowel symptoms.  He denies fatigue and reports a good appetite and is maintaining his weight. Overall, he is quite pleased with his progress to date.  ALLERGIES:  has No Known Allergies.  Meds: Current Outpatient Medications  Medication Sig Dispense Refill  . allopurinol (ZYLOPRIM) 300 MG tablet Take 300 mg by mouth every morning.  1  . amLODipine (NORVASC) 5 MG tablet Take 5 mg by mouth daily.    Marland Kitchen aspirin EC 81 MG tablet Take 81 mg by mouth daily.    . blood glucose meter kit and supplies KIT Dispense based on patient and insurance preference. Use up to four times daily as directed. (FOR ICD-9 250.00, 250.01). 1 each 0  . Cholecalciferol (VITAMIN D3) 1000 units CAPS Take 2,000 Units by mouth daily.    Marland Kitchen HYDROcodone-acetaminophen (NORCO) 5-325 MG tablet Take 1 tablet by mouth every 4 (four) hours as needed  for moderate pain. 6 tablet 0  . metFORMIN (GLUCOPHAGE) 1000 MG tablet Take 1 tablet (1,000 mg total) by mouth 2 (two) times daily with a meal. 60 tablet 0  . metoprolol succinate (TOPROL-XL) 50 MG 24 hr tablet Take 50 mg by mouth daily.    . rosuvastatin (CRESTOR) 5 MG tablet Take 5 mg by mouth daily.    . valsartan-hydrochlorothiazide (DIOVAN-HCT) 160-25 MG tablet Take 1 tablet by mouth daily.    . vitamin B-12 (CYANOCOBALAMIN) 500 MCG tablet Take 500 mcg by mouth daily.     No current facility-administered medications for this visit.     Physical Findings: In general this is a well appearing African American male in no acute distress. He's alert and oriented x4 and appropriate throughout the examination. Cardiopulmonary assessment is negative for acute distress and he exhibits normal effort.   Lab Findings: Lab Results  Component Value Date   WBC 7.9 09/06/2019   HGB 10.0 (L) 09/06/2019   HCT 32.3 (L) 09/06/2019   MCV 97.9 09/06/2019   PLT 170 09/06/2019    Radiographic Findings:  Patient underwent CT imaging in our clinic for post implant dosimetry. The CT will be reviewed by Dr. Tammi Klippel to confirm there is an adequate distribution of radioactive seeds throughout the prostate gland and ensure that there are no seeds in or near the rectum. His scheduled for prostate MRI at 12 noon today and those images will be fused with his CT images for further evaluation. We suspect the final  radiation plan and dosimetry will show appropriate coverage of the prostate gland. He understands that we will call and inform him of any unexpected findings on further review of his imaging and dosimetry.  Impression/Plan: 68 y.o. gentleman with Stage T1c adenocarcinoma of the prostate with Gleason score of 3+4, and PSA of 5.41. The patient is recovering from the effects of radiation. His urinary symptoms should gradually improve over the next 4-6 months. We talked about this today. He is encouraged by his  improvement already and is otherwise pleased with his outcome. He is interested in trying a trial of alpha blocker to see if this will help manage his LUTS.  He understands that this will not be necessary long term but will likely provide significant relief over the next 2-3 months until the effects of the radiation begin to subside. I have sent a Rx for Rapaflo to his pharmacy and he will start this tonight and report on it's effectiveness when he sees Dr. Gloriann Loan on Friday. We also talked about long-term follow-up for prostate cancer following seed implant. He understands that ongoing PSA determinations and digital rectal exams will help perform surveillance to rule out disease recurrence. He has a follow up appointment scheduled with Dr. Gloriann Loan on Friday, 09/28/19. He understands what to expect with his PSA measures. Patient was also educated today about some of the long-term effects from radiation including a small risk for rectal bleeding and possibly erectile dysfunction. We talked about some of the general management approaches to these potential complications. However, I did encourage the patient to contact our office or return at any point if he has questions or concerns related to his previous radiation and prostate cancer.    Jorge Johns, PA-C

## 2019-09-26 NOTE — Progress Notes (Signed)
Jorge Decker is here today for a follow-up appointment . Patient has a  MRI scheduled for today @ 12 noon. Patient has an appointment with urinologist on  12/4 Dysuria None Hematuria Sometime Nocturia x4 Does the patient empties his bladder with urination? no Patient states that his stream is weak. Bowels no issues Does the patient have any urgency or leakage ? Patient denies any leakage ,but he has some urgency. Vitals:   09/26/19 0925  BP: (!) 138/95  Pulse: 90  Resp: 20  SpO2: 100%  Weight: 189 lb (85.7 kg)

## 2019-09-28 DIAGNOSIS — C61 Malignant neoplasm of prostate: Secondary | ICD-10-CM | POA: Diagnosis not present

## 2019-09-30 NOTE — Progress Notes (Signed)
  Radiation Oncology         [phone] ________________________________  Name: Jorge Decker MRN: [id number]  Date: 09/26/2019  DOB: [birth year]  COMPLEX SIMULATION NOTE  NARRATIVE:  The patient was brought to the [facility] today following prostate seed implantation approximately one month ago.  Identity was confirmed.  All relevant records and images related to the planned course of therapy were reviewed.  Then, the patient was set-up supine.  CT images were obtained.  The CT images were loaded into the planning software.  Then the prostate and rectum were contoured.  Treatment planning then occurred.  The implanted iodine 125 seeds were identified by the physics staff for projection of radiation distribution  I have requested : 3D Simulation  I have requested a DVH of the following structures: Prostate and rectum.    ________________________________  Sheral Apley Tammi Klippel, M.D.

## 2019-10-03 ENCOUNTER — Ambulatory Visit: Payer: HMO | Admitting: Radiation Oncology

## 2019-10-03 ENCOUNTER — Other Ambulatory Visit: Payer: Self-pay

## 2019-10-03 NOTE — Patient Outreach (Signed)
  [facility]) Care Management Chronic Special Needs Program  10/03/2019  Name: Jorge Decker DOB: Jan 24, 1951  MRN: [id number]  [name] is enrolled in a chronic special needs plan for Diabetes. Client called with no answer No answer and HIPAA compliant message left. 1st attempt Plan for 2nd outreach call in 1-2 weeks Chronic care management coordinator will attempt outreach in 1-2 weeks .   Peter Garter RN, Jackquline Denmark, CDE Chronic Care Management Coordinator [facility] Management [phone]

## 2019-10-09 ENCOUNTER — Encounter: Payer: Self-pay | Admitting: Radiation Oncology

## 2019-10-09 DIAGNOSIS — C61 Malignant neoplasm of prostate: Secondary | ICD-10-CM | POA: Diagnosis not present

## 2019-10-11 ENCOUNTER — Other Ambulatory Visit: Payer: Self-pay

## 2019-10-11 NOTE — Patient Outreach (Signed)
  [facility]) Care Management Chronic Special Needs Program  10/11/2019  Name: Jorge Decker DOB: June 06, 1951  MRN: [id number]  Mr. Jorge Decker is enrolled in a chronic special needs plan for Diabetes. Client called with no answer No answer and HIPAA compliant message left.  2nd attempt Plan for 3rd outreach call in 1-2 weeks Chronic care management coordinator will attempt outreach in 1-2 weeks.   Peter Garter RN, Jackquline Denmark, CDE Chronic Care Management Coordinator [facility] [phone]

## 2019-10-22 ENCOUNTER — Other Ambulatory Visit: Payer: Self-pay

## 2019-10-22 NOTE — Patient Outreach (Signed)
  [facility]) Care Management Chronic Special Needs Program  10/22/2019  Name: Jorge Decker DOB: 1951/08/03  MRN: [id number]  Mr. Erskine Rudow is enrolled in a chronic special needs plan for Diabetes. Reviewed and updated care plan.  Subjective:  Client states that he has been doing good.  States that his blood sugars have been good and ranging from 98-110 and he usually checks every other day.  States he tries to eat healthy.  States his B/P has been good when he has it checked.  States he has the Advanced Directives forms but has not completed yet.   Goals Addressed            This Visit's Progress   . COMPLETED:  Acknowledge receipt of Actor mailed    . COMPLETED: Client understands the importance of follow-up with providers by attending scheduled visits   On track    Keeping scheduled appointments    . Client will use Assistive Devices as needed and verbalize understanding of device use   On track   . Client will verbalize knowledge of self management of Hypertension as evidences by BP reading of 140/90 or less; or as defined by provider   On track   . HEMOGLOBIN A1C < 7.0       Last Hemoglobin A1C 6% 07/11/19 Diabetes self management actions:  Glucose monitoring per provider recommendations  Perform Quality checks on blood meter  Eat Healthy  Check feet daily  Visit provider every 3-6 months as directed  Hbg A1C level every 3-6 months.  Eye Exam yearly    . COMPLETED: Maintain timely refills of diabetic medication as prescribed within the year .   On track    Maintaining timely refills    . COMPLETED: Obtain annual  Lipid Profile, LDL-C       Completed 12/18/18    . COMPLETED: Obtain Annual Eye (retinal)  Exam        Completed 12/21/18    . Obtain Annual Foot Exam   Not on track   . COMPLETED: Obtain annual screen for micro albuminuria (urine) , nephropathy (kidney problems)       Completed 12/21/18    .  COMPLETED: Obtain Hemoglobin A1C at least 2 times per year       Completed 12/21/18, 07/11/19    . COMPLETED: Visit Primary Care Provider or Endocrinologist at least 2 times per year        Primary care provider 12/21/18, 07/11/19     Client is  meeting diabetes self management goal of hemoglobin A1C of <7% with last reading of 6% Reinforced to follow a low CHO low sodium diet Encouraged to get regular exercise Encouraged to completed his Advanced Directives   Plan:  Send successful outreach letter with a copy of their individualized care plan and Send individual care plan to provider  Chronic care management coordinator will outreach in:  6-9 Months per tier level    Peter Garter RN, Jackquline Denmark, [place] Management Coordinator [place] [facility] [phone]

## 2019-10-25 NOTE — Progress Notes (Signed)
  Radiation Oncology         [phone] ________________________________  Name: Jorge Decker MRN: [id number]  Date: 10/09/2019  DOB: November 22, 1950  3D Planning Note   Prostate Brachytherapy Post-Implant Dosimetry  Diagnosis: 68 y.o. gentleman with Stage T1c adenocarcinoma of the prostate with Gleason score of 3+4, and PSA of 5.41  Narrative: On a previous date, [name] returned following prostate seed implantation for post implant planning. He underwent CT scan complex simulation to delineate the three-dimensional structures of the pelvis and demonstrate the radiation distribution.  Since that time, the seed localization, and complex isodose planning with dose volume histograms have now been completed.  Results:   Prostate Coverage - The dose of radiation delivered to the 90% or more of the prostate gland (D90) was 100.55% of the prescription dose. This exceeds our goal of greater than 90%. Rectal Sparing - The volume of rectal tissue receiving the prescription dose or higher was 0.0 cc. This falls under our thresholds tolerance of 1.0 cc.  Impression: The prostate seed implant appears to show adequate target coverage and appropriate rectal sparing.  Plan:  The patient will continue to follow with urology for ongoing PSA determinations. I would anticipate a high likelihood for local tumor control with minimal risk for rectal morbidity.  ________________________________  Sheral Apley Tammi Klippel, M.D.

## 2019-12-27 DIAGNOSIS — E538 Deficiency of other specified B group vitamins: Secondary | ICD-10-CM | POA: Diagnosis not present

## 2019-12-27 DIAGNOSIS — I10 Essential (primary) hypertension: Secondary | ICD-10-CM | POA: Diagnosis not present

## 2019-12-27 DIAGNOSIS — E782 Mixed hyperlipidemia: Secondary | ICD-10-CM | POA: Diagnosis not present

## 2019-12-27 DIAGNOSIS — C61 Malignant neoplasm of prostate: Secondary | ICD-10-CM | POA: Diagnosis not present

## 2019-12-27 DIAGNOSIS — Z0001 Encounter for general adult medical examination with abnormal findings: Secondary | ICD-10-CM | POA: Diagnosis not present

## 2019-12-27 DIAGNOSIS — M109 Gout, unspecified: Secondary | ICD-10-CM | POA: Diagnosis not present

## 2019-12-27 DIAGNOSIS — E119 Type 2 diabetes mellitus without complications: Secondary | ICD-10-CM | POA: Diagnosis not present

## 2019-12-27 DIAGNOSIS — D649 Anemia, unspecified: Secondary | ICD-10-CM | POA: Diagnosis not present

## 2019-12-27 DIAGNOSIS — E559 Vitamin D deficiency, unspecified: Secondary | ICD-10-CM | POA: Diagnosis not present

## 2020-01-03 DIAGNOSIS — N5201 Erectile dysfunction due to arterial insufficiency: Secondary | ICD-10-CM | POA: Diagnosis not present

## 2020-01-03 DIAGNOSIS — C61 Malignant neoplasm of prostate: Secondary | ICD-10-CM | POA: Diagnosis not present

## 2020-01-03 DIAGNOSIS — R351 Nocturia: Secondary | ICD-10-CM | POA: Diagnosis not present

## 2020-01-21 ENCOUNTER — Ambulatory Visit: Payer: HMO

## 2020-01-21 ENCOUNTER — Ambulatory Visit: Payer: HMO | Attending: Internal Medicine

## 2020-01-21 DIAGNOSIS — Z23 Encounter for immunization: Secondary | ICD-10-CM

## 2020-01-21 NOTE — Progress Notes (Signed)
   Covid-19 Vaccination Clinic  Name:  Jorge Decker    MRN: [id number] DOB: [birth year]  01/21/2020  Mr. Jorge Decker was observed post Covid-19 immunization for 15 minutes without incident. He was provided with Vaccine Information Sheet and instruction to access the V-Safe system.   Mr. Jorge Decker was instructed to call 911 with any severe reactions post vaccine: Marland Kitchen Difficulty breathing  . Swelling of face and throat  . A fast heartbeat  . A bad rash all over body  . Dizziness and weakness   Immunizations Administered    Name Date Dose VIS Date Route   Pfizer COVID-19 Vaccine 01/21/2020  8:20 AM 0.3 mL 10/05/2019 Intramuscular   Manufacturer: [place]   Lot: [id number]   [place]: [id number]

## 2020-02-13 ENCOUNTER — Ambulatory Visit: Payer: HMO | Attending: Internal Medicine

## 2020-02-13 DIAGNOSIS — Z23 Encounter for immunization: Secondary | ICD-10-CM

## 2020-02-13 NOTE — Progress Notes (Signed)
   Covid-19 Vaccination Clinic  Name:  Jorge Decker    MRN: [id number] DOB: September 14, 1951  02/13/2020  [name] was observed post Covid-19 immunization for 15 minutes without incident. He was provided with Vaccine Information Sheet and instruction to access the V-Safe system.   [name] was instructed to call 911 with any severe reactions post vaccine: Marland Kitchen Difficulty breathing  . Swelling of face and throat  . A fast heartbeat  . A bad rash all over body  . Dizziness and weakness   Immunizations Administered    Name Date Dose VIS Date Route   Pfizer COVID-19 Vaccine 02/13/2020  8:19 AM 0.3 mL 12/19/2018 Intramuscular   Manufacturer: [place]   Lot: [id number]   [place]: [id number]

## 2020-04-04 DIAGNOSIS — C61 Malignant neoplasm of prostate: Secondary | ICD-10-CM | POA: Diagnosis not present

## 2020-04-11 DIAGNOSIS — C61 Malignant neoplasm of prostate: Secondary | ICD-10-CM | POA: Diagnosis not present

## 2020-04-11 DIAGNOSIS — R351 Nocturia: Secondary | ICD-10-CM | POA: Diagnosis not present

## 2020-04-11 DIAGNOSIS — N5201 Erectile dysfunction due to arterial insufficiency: Secondary | ICD-10-CM | POA: Diagnosis not present

## 2020-04-21 ENCOUNTER — Other Ambulatory Visit: Payer: Self-pay

## 2020-04-21 NOTE — Patient Outreach (Signed)
  [facility]) Care Management Chronic Special Needs Program  04/21/2020  Name: Jorge Decker DOB: 1951/01/22  MRN: [id number]  Mr. Jorge Decker is enrolled in a chronic special needs plan for Diabetes. Client called with no answer No answer and HIPAA compliant message left. 1st attempt Plan for 2nd outreach call in 1-2 weeks Chronic care management coordinator will attempt outreach in 1-2 weeks .   Peter Garter RN, Jackquline Denmark, CDE Chronic Care Management Coordinator [place] [facility] [phone]

## 2020-04-30 ENCOUNTER — Other Ambulatory Visit: Payer: Self-pay

## 2020-04-30 NOTE — Patient Outreach (Signed)
°  [facility]) Care Management Chronic Special Needs Program  04/30/2020  Name: Jorge Decker DOB: 07-24-1951  MRN: [id number]  Mr. Jorge Decker is enrolled in a chronic special needs plan for Diabetes. Client called with no answer No answer and HIPAA compliant message left. 2nd attempt Plan for 3rd outreach call in one week Chronic care management coordinator will attempt outreach in one week.   Peter Garter RN, Jackquline Denmark, CDE Chronic Care Management Coordinator [place] [facility] [phone]

## 2020-05-06 ENCOUNTER — Other Ambulatory Visit: Payer: Self-pay

## 2020-05-06 NOTE — Patient Outreach (Signed)
[facility]) Care Management Chronic Special Needs Program  05/06/2020  Name: Rita Prom Probert DOB: 03/13/1951  MRN: [id number]  Mr. Chesky Heyer is enrolled in a chronic special needs plan for Diabetes. Chronic Care Management Coordinator telephoned client to review health risk assessment and to update individualized care plan.  Reviewed the chronic care management program, importance of client participation, and taking their care plan to all provider appointments and inpatient facilities.  Reviewed the transition of care process and possible referral to community care management.  Subjective: Client states that he has been doing good.  States he is working part-time and gets lots of walking with his work.  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  States his blood sugars range from 110-135 in the morning and sometimes will be up to 185 after eating if he eats something sweet.  States he still has not gotten a new glucometer and is paying for his strips. States he tries to eat healthySTaets he is seen by his urologists regularly to keep up with his prostate cancer.  Denies any problems dealing with his prostate issues.  States his B/P is good when it is checked. States he has gotten both of his COVID shots.  STaets he has the Advanced Directives forms but has not gotten them completed  Goals Addressed            This Visit's Progress   . Client will use Assistive Devices as needed and verbalize understanding of device use   Not on track    Reports issues with cost of glucometer strips Reviewed [employer] approved meters-One Touch, Freestyle or Precision  Called Dr.Gates office to  request to send in prescription for one of the approved meters Instructed client that his strips and testing supplies should be at no cost if he uses approved meter    . Client will verbalize knowledge of self management of Hypertension as evidences by BP reading of 140/90 or less; or as  defined by provider   On track    Reports less than 140/90 Plan to check B/P regularly Take B/P medications as ordered Plan to follow a low salt diet  Increase activity as tolerated Sent EMMI: Controlling your blood pressure through lifestyle     . HEMOGLOBIN A1C < 7       Last Hemoglobin A1C 6.7% 12/27/19 Reviewed fasting blood sugar goals of 80-130 and less than 180 1 1/2-2 hours after meals Reinforced to follow a low carbohydrate low salt diet and to watch portion sizes Reviewed use and possible side effects of diabetes medications  Reviewed signs and symptoms of hypoglycemia and actions to take Please reviewed diabetes action plan in [place] calendar    . COMPLETED: Obtain annual  Lipid Profile, LDL-C   On track    Completed 12/27/19 LDL 28 The goal for LDL is less than 70 mg/dL as you are at high risk for complications Try to avoid saturated fats, trans-fats and eat more fiber Plan to take statin as ordered  Goal completed 05/06/20    . Obtain Annual Eye (retinal)  Exam    On track    Last completed 12/21/18 Plan to have a dilated eye exam every year    . COMPLETED: Obtain Annual Foot Exam   On track    Completed 12/27/19 Check your skin and feet every day for cuts, bruises, redness, blisters, or sores. Report to provider any problems with your feet Schedule a foot  exam with your health care provider once every year Goal completed 12/27/19    . COMPLETED: Obtain annual screen for micro albuminuria (urine) , nephropathy (kidney problems)   On track    Completed 12/27/19 It is important for your doctor to check your urine for protein at least every year Goal completed 05/06/20    . Obtain Hemoglobin A1C at least 2 times per year   On track    Completed 12/27/19 It is important to have your Hemoglobin A1C checked every 6 months if you are at goal and every 3 months if you are not at goal    . Visit Primary Care Provider or Endocrinologist at least 2 times per year    On  track    Primary care provider 12/27/19 Annual Wellness Visit 12/27/19 Plan to keep scheduled appointments with providers       Plan:  Send successful outreach letter with a copy of their individualized care plan, Send individual care plan to provider and Send educational material-EMMI-Controlling your blood pressure through lifestyle  Client will be outreached by a [employer] ([employer]) RNCM in 12 months per tier level     [place], Jackquline Denmark, [place] [facility] [phone]

## 2020-07-03 DIAGNOSIS — E782 Mixed hyperlipidemia: Secondary | ICD-10-CM | POA: Diagnosis not present

## 2020-07-03 DIAGNOSIS — Z23 Encounter for immunization: Secondary | ICD-10-CM | POA: Diagnosis not present

## 2020-07-03 DIAGNOSIS — Z7984 Long term (current) use of oral hypoglycemic drugs: Secondary | ICD-10-CM | POA: Diagnosis not present

## 2020-07-03 DIAGNOSIS — C61 Malignant neoplasm of prostate: Secondary | ICD-10-CM | POA: Diagnosis not present

## 2020-07-03 DIAGNOSIS — E538 Deficiency of other specified B group vitamins: Secondary | ICD-10-CM | POA: Diagnosis not present

## 2020-07-03 DIAGNOSIS — E119 Type 2 diabetes mellitus without complications: Secondary | ICD-10-CM | POA: Diagnosis not present

## 2020-07-03 DIAGNOSIS — I10 Essential (primary) hypertension: Secondary | ICD-10-CM | POA: Diagnosis not present

## 2020-07-03 DIAGNOSIS — M109 Gout, unspecified: Secondary | ICD-10-CM | POA: Diagnosis not present

## 2020-07-03 DIAGNOSIS — N5201 Erectile dysfunction due to arterial insufficiency: Secondary | ICD-10-CM | POA: Diagnosis not present

## 2020-07-03 DIAGNOSIS — D649 Anemia, unspecified: Secondary | ICD-10-CM | POA: Diagnosis not present

## 2020-09-02 ENCOUNTER — Other Ambulatory Visit: Payer: Self-pay

## 2020-09-02 NOTE — Patient Outreach (Signed)
  [facility]) Care Management Chronic Special Needs Program    09/02/2020  Name: CORY KITT, DOB: Nov 28, 1950  MRN: [id number]   Mr. Amear Strojny is enrolled in a chronic special needs plan for Diabetes.  [place] Management will continue to provide services for this client through 10/24/2020. The [employer] care management team will assume care 10/25/2020.  Peter Garter RN, Jackquline Denmark, CDE Chronic Care Management Coordinator [place] [facility] [phone]

## 2020-09-26 DIAGNOSIS — C61 Malignant neoplasm of prostate: Secondary | ICD-10-CM | POA: Diagnosis not present

## 2020-10-03 DIAGNOSIS — C61 Malignant neoplasm of prostate: Secondary | ICD-10-CM | POA: Diagnosis not present

## 2020-10-03 DIAGNOSIS — R351 Nocturia: Secondary | ICD-10-CM | POA: Diagnosis not present

## 2020-10-03 DIAGNOSIS — N5201 Erectile dysfunction due to arterial insufficiency: Secondary | ICD-10-CM | POA: Diagnosis not present

## 2020-10-28 ENCOUNTER — Other Ambulatory Visit: Payer: Self-pay

## 2020-11-13 DIAGNOSIS — E119 Type 2 diabetes mellitus without complications: Secondary | ICD-10-CM | POA: Diagnosis not present

## 2020-12-18 DIAGNOSIS — H401131 Primary open-angle glaucoma, bilateral, mild stage: Secondary | ICD-10-CM | POA: Diagnosis not present

## 2020-12-25 DIAGNOSIS — R9721 Rising PSA following treatment for malignant neoplasm of prostate: Secondary | ICD-10-CM | POA: Diagnosis not present

## 2021-01-01 DIAGNOSIS — C61 Malignant neoplasm of prostate: Secondary | ICD-10-CM | POA: Diagnosis not present

## 2021-01-01 DIAGNOSIS — N5201 Erectile dysfunction due to arterial insufficiency: Secondary | ICD-10-CM | POA: Diagnosis not present

## 2021-01-14 DIAGNOSIS — Z7984 Long term (current) use of oral hypoglycemic drugs: Secondary | ICD-10-CM | POA: Diagnosis not present

## 2021-01-14 DIAGNOSIS — E1165 Type 2 diabetes mellitus with hyperglycemia: Secondary | ICD-10-CM | POA: Diagnosis not present

## 2021-01-14 DIAGNOSIS — E538 Deficiency of other specified B group vitamins: Secondary | ICD-10-CM | POA: Diagnosis not present

## 2021-01-14 DIAGNOSIS — M109 Gout, unspecified: Secondary | ICD-10-CM | POA: Diagnosis not present

## 2021-01-14 DIAGNOSIS — Z0000 Encounter for general adult medical examination without abnormal findings: Secondary | ICD-10-CM | POA: Diagnosis not present

## 2021-01-14 DIAGNOSIS — E559 Vitamin D deficiency, unspecified: Secondary | ICD-10-CM | POA: Diagnosis not present

## 2021-01-14 DIAGNOSIS — E782 Mixed hyperlipidemia: Secondary | ICD-10-CM | POA: Diagnosis not present

## 2021-01-14 DIAGNOSIS — C61 Malignant neoplasm of prostate: Secondary | ICD-10-CM | POA: Diagnosis not present

## 2021-01-14 DIAGNOSIS — N5201 Erectile dysfunction due to arterial insufficiency: Secondary | ICD-10-CM | POA: Diagnosis not present

## 2021-01-14 DIAGNOSIS — E0859 Diabetes mellitus due to underlying condition with other circulatory complications: Secondary | ICD-10-CM | POA: Diagnosis not present

## 2021-01-14 DIAGNOSIS — I10 Essential (primary) hypertension: Secondary | ICD-10-CM | POA: Diagnosis not present

## 2021-01-14 DIAGNOSIS — D649 Anemia, unspecified: Secondary | ICD-10-CM | POA: Diagnosis not present

## 2021-01-15 DIAGNOSIS — H401131 Primary open-angle glaucoma, bilateral, mild stage: Secondary | ICD-10-CM | POA: Diagnosis not present

## 2021-02-19 DIAGNOSIS — H401131 Primary open-angle glaucoma, bilateral, mild stage: Secondary | ICD-10-CM | POA: Diagnosis not present

## 2021-06-25 DIAGNOSIS — C61 Malignant neoplasm of prostate: Secondary | ICD-10-CM | POA: Diagnosis not present

## 2021-07-10 DIAGNOSIS — C61 Malignant neoplasm of prostate: Secondary | ICD-10-CM | POA: Diagnosis not present

## 2021-08-18 IMAGING — CR DG CHEST 2V
2 series · 2 of 2 positions shown · non-contrast
Comparison: None

CLINICAL DATA: Preoperative evaluation for prostate seed placement

EXAM:
CHEST - 2 VIEW

[w chest pa]
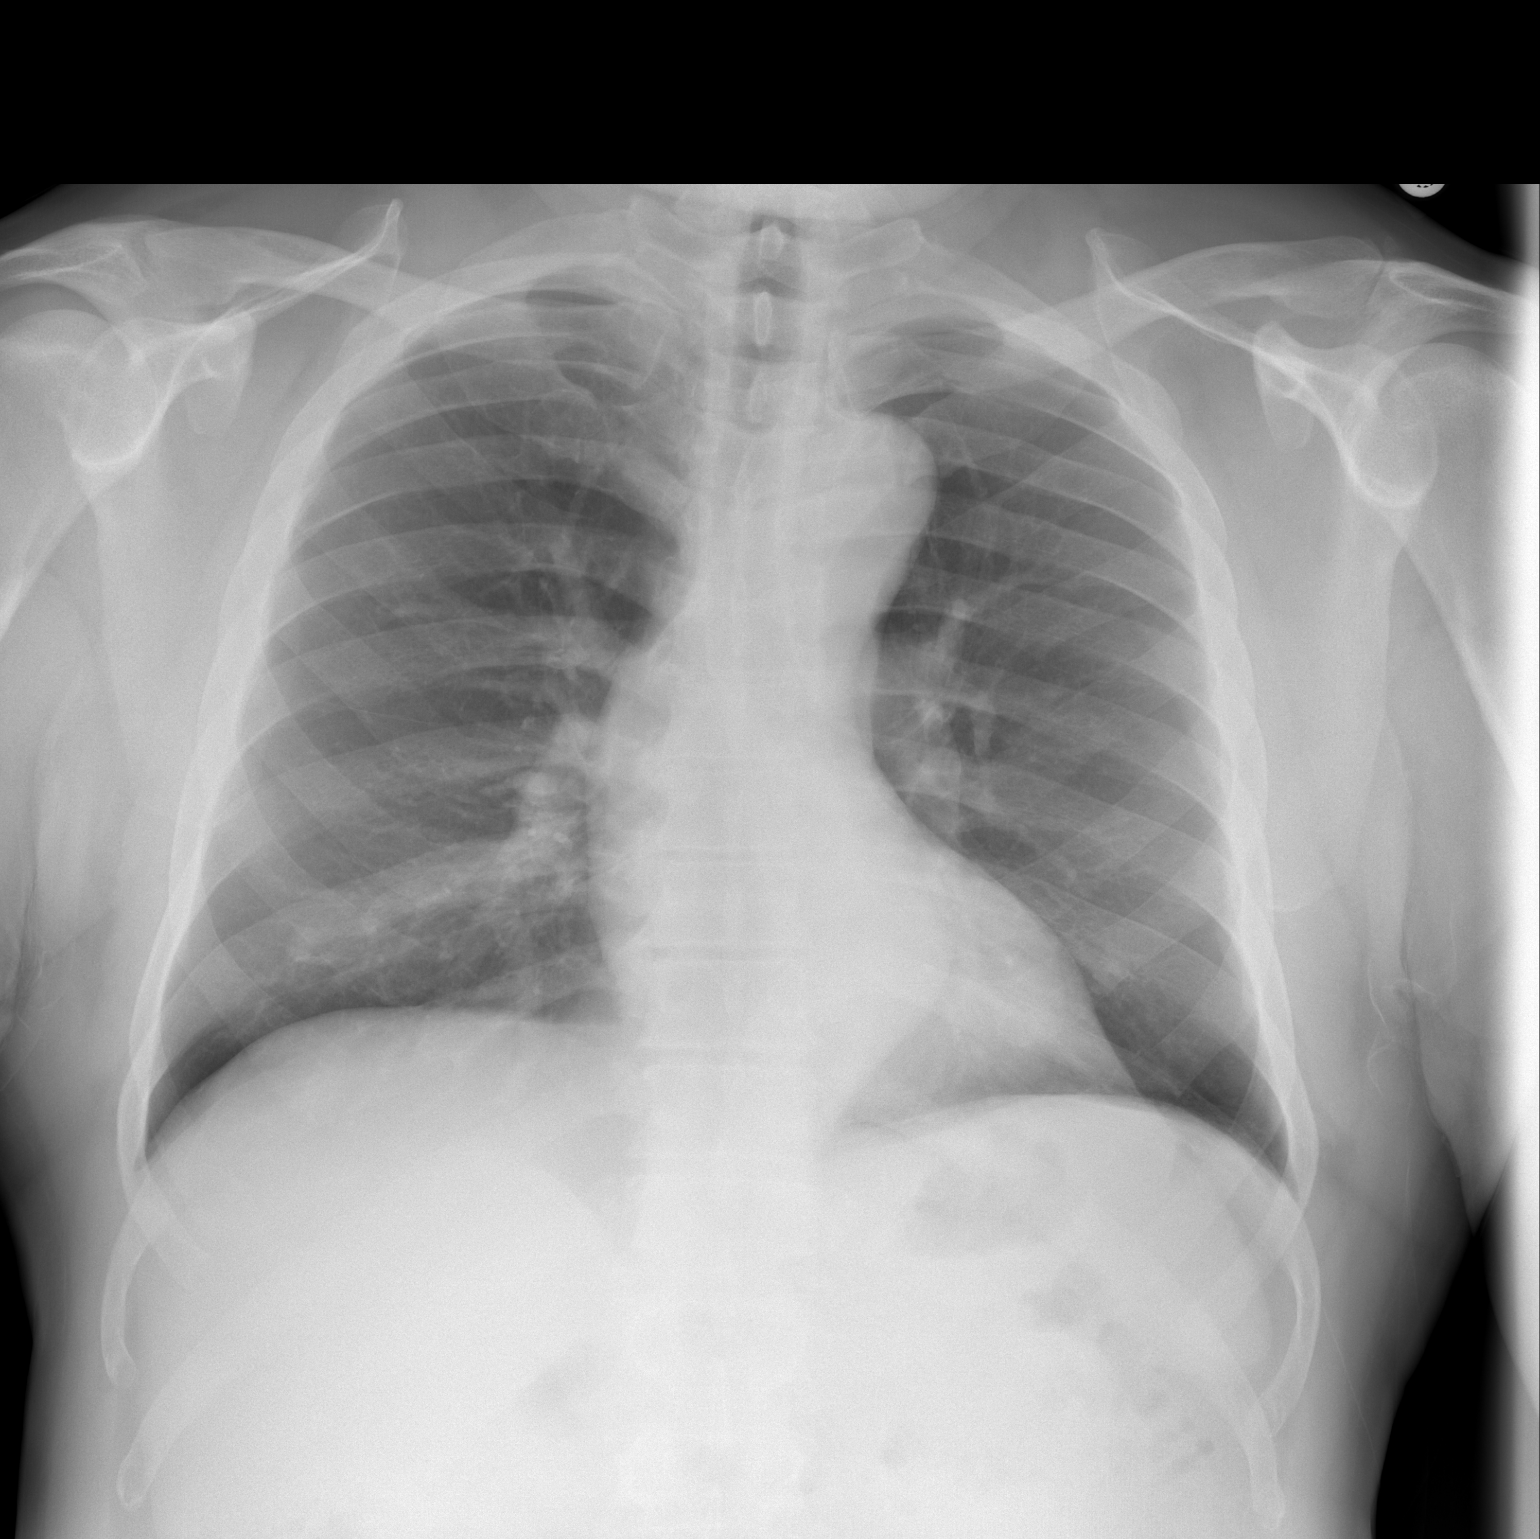

[w chest lat]
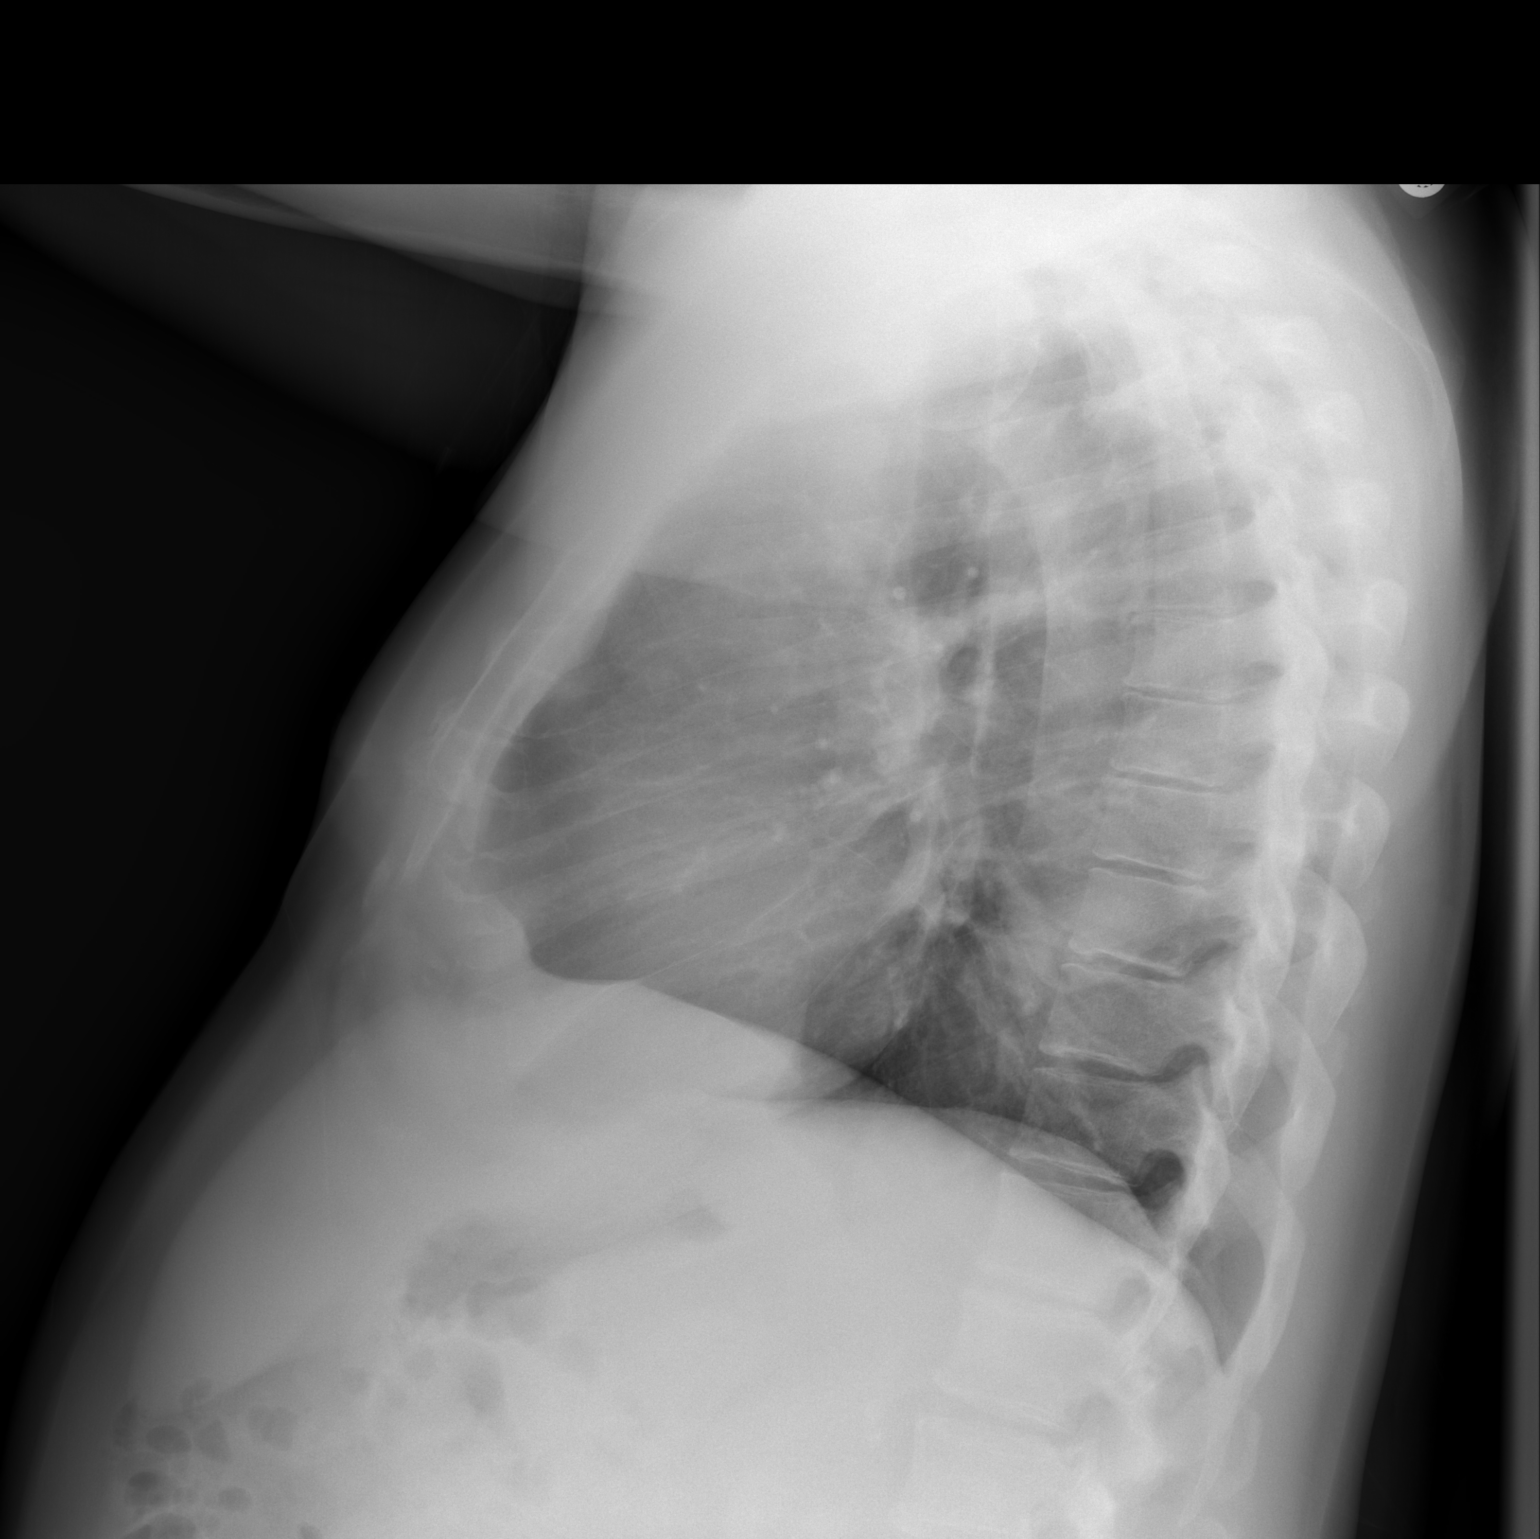

[2 of 2 positions shown; findings below may reference images not displayed]

FINDINGS: Normal heart size and pulmonary vascularity.

Tortuous thoracic aorta.

Question RIGHT nipple shadow.

Lungs otherwise clear.

No pulmonary infiltrate, pleural effusion, or pneumothorax.

Bones unremarkable.
IMPRESSION: Question RIGHT nipple shadow; repeat PA chest radiograph with nipple
markers recommended to exclude pulmonary nodule.

These results will be called to the ordering clinician or
representative by the Radiologist Assistant, and communication
documented in the PACS or zVision Dashboard.

## 2021-08-20 DIAGNOSIS — H401131 Primary open-angle glaucoma, bilateral, mild stage: Secondary | ICD-10-CM | POA: Diagnosis not present

## 2021-12-24 DIAGNOSIS — C61 Malignant neoplasm of prostate: Secondary | ICD-10-CM | POA: Diagnosis not present

## 2021-12-31 DIAGNOSIS — C61 Malignant neoplasm of prostate: Secondary | ICD-10-CM | POA: Diagnosis not present

## 2021-12-31 DIAGNOSIS — N5201 Erectile dysfunction due to arterial insufficiency: Secondary | ICD-10-CM | POA: Diagnosis not present

## 2022-01-21 DIAGNOSIS — E538 Deficiency of other specified B group vitamins: Secondary | ICD-10-CM | POA: Diagnosis not present

## 2022-01-21 DIAGNOSIS — Z0001 Encounter for general adult medical examination with abnormal findings: Secondary | ICD-10-CM | POA: Diagnosis not present

## 2022-01-21 DIAGNOSIS — M109 Gout, unspecified: Secondary | ICD-10-CM | POA: Diagnosis not present

## 2022-01-21 DIAGNOSIS — E782 Mixed hyperlipidemia: Secondary | ICD-10-CM | POA: Diagnosis not present

## 2022-01-21 DIAGNOSIS — Z1211 Encounter for screening for malignant neoplasm of colon: Secondary | ICD-10-CM | POA: Diagnosis not present

## 2022-01-21 DIAGNOSIS — E0859 Diabetes mellitus due to underlying condition with other circulatory complications: Secondary | ICD-10-CM | POA: Diagnosis not present

## 2022-01-21 DIAGNOSIS — D649 Anemia, unspecified: Secondary | ICD-10-CM | POA: Diagnosis not present

## 2022-01-21 DIAGNOSIS — E559 Vitamin D deficiency, unspecified: Secondary | ICD-10-CM | POA: Diagnosis not present

## 2022-01-21 DIAGNOSIS — C61 Malignant neoplasm of prostate: Secondary | ICD-10-CM | POA: Diagnosis not present

## 2022-01-21 DIAGNOSIS — N5201 Erectile dysfunction due to arterial insufficiency: Secondary | ICD-10-CM | POA: Diagnosis not present

## 2022-01-21 DIAGNOSIS — I10 Essential (primary) hypertension: Secondary | ICD-10-CM | POA: Diagnosis not present

## 2022-02-18 DIAGNOSIS — H401131 Primary open-angle glaucoma, bilateral, mild stage: Secondary | ICD-10-CM | POA: Diagnosis not present

## 2022-05-20 DIAGNOSIS — E119 Type 2 diabetes mellitus without complications: Secondary | ICD-10-CM | POA: Diagnosis not present

## 2022-06-24 DIAGNOSIS — H401131 Primary open-angle glaucoma, bilateral, mild stage: Secondary | ICD-10-CM | POA: Diagnosis not present

## 2022-07-01 DIAGNOSIS — C61 Malignant neoplasm of prostate: Secondary | ICD-10-CM | POA: Diagnosis not present

## 2022-07-01 DIAGNOSIS — R351 Nocturia: Secondary | ICD-10-CM | POA: Diagnosis not present

## 2022-12-09 DIAGNOSIS — H401131 Primary open-angle glaucoma, bilateral, mild stage: Secondary | ICD-10-CM | POA: Diagnosis not present

## 2022-12-23 DIAGNOSIS — C61 Malignant neoplasm of prostate: Secondary | ICD-10-CM | POA: Diagnosis not present

## 2023-01-06 DIAGNOSIS — R351 Nocturia: Secondary | ICD-10-CM | POA: Diagnosis not present

## 2023-01-06 DIAGNOSIS — N5201 Erectile dysfunction due to arterial insufficiency: Secondary | ICD-10-CM | POA: Diagnosis not present

## 2023-01-06 DIAGNOSIS — C61 Malignant neoplasm of prostate: Secondary | ICD-10-CM | POA: Diagnosis not present

## 2023-03-03 DIAGNOSIS — H401131 Primary open-angle glaucoma, bilateral, mild stage: Secondary | ICD-10-CM | POA: Diagnosis not present

## 2023-05-05 DIAGNOSIS — Z1211 Encounter for screening for malignant neoplasm of colon: Secondary | ICD-10-CM | POA: Diagnosis not present

## 2023-05-05 DIAGNOSIS — I10 Essential (primary) hypertension: Secondary | ICD-10-CM | POA: Diagnosis not present

## 2023-05-05 DIAGNOSIS — E559 Vitamin D deficiency, unspecified: Secondary | ICD-10-CM | POA: Diagnosis not present

## 2023-05-05 DIAGNOSIS — D649 Anemia, unspecified: Secondary | ICD-10-CM | POA: Diagnosis not present

## 2023-05-05 DIAGNOSIS — E782 Mixed hyperlipidemia: Secondary | ICD-10-CM | POA: Diagnosis not present

## 2023-05-05 DIAGNOSIS — Z0000 Encounter for general adult medical examination without abnormal findings: Secondary | ICD-10-CM | POA: Diagnosis not present

## 2023-05-05 DIAGNOSIS — Z79899 Other long term (current) drug therapy: Secondary | ICD-10-CM | POA: Diagnosis not present

## 2023-05-05 DIAGNOSIS — M109 Gout, unspecified: Secondary | ICD-10-CM | POA: Diagnosis not present

## 2023-05-05 DIAGNOSIS — C61 Malignant neoplasm of prostate: Secondary | ICD-10-CM | POA: Diagnosis not present

## 2023-05-05 DIAGNOSIS — E1136 Type 2 diabetes mellitus with diabetic cataract: Secondary | ICD-10-CM | POA: Diagnosis not present

## 2023-05-05 DIAGNOSIS — Z1331 Encounter for screening for depression: Secondary | ICD-10-CM | POA: Diagnosis not present

## 2023-05-05 DIAGNOSIS — E538 Deficiency of other specified B group vitamins: Secondary | ICD-10-CM | POA: Diagnosis not present

## 2023-05-05 DIAGNOSIS — N5201 Erectile dysfunction due to arterial insufficiency: Secondary | ICD-10-CM | POA: Diagnosis not present

## 2023-05-09 DIAGNOSIS — D649 Anemia, unspecified: Secondary | ICD-10-CM | POA: Diagnosis not present

## 2023-05-09 DIAGNOSIS — E875 Hyperkalemia: Secondary | ICD-10-CM | POA: Diagnosis not present

## 2023-05-09 DIAGNOSIS — E1136 Type 2 diabetes mellitus with diabetic cataract: Secondary | ICD-10-CM | POA: Diagnosis not present

## 2023-05-09 DIAGNOSIS — I10 Essential (primary) hypertension: Secondary | ICD-10-CM | POA: Diagnosis not present

## 2023-05-09 DIAGNOSIS — R748 Abnormal levels of other serum enzymes: Secondary | ICD-10-CM | POA: Diagnosis not present

## 2023-05-20 DIAGNOSIS — N189 Chronic kidney disease, unspecified: Secondary | ICD-10-CM | POA: Diagnosis not present

## 2023-05-27 DIAGNOSIS — E559 Vitamin D deficiency, unspecified: Secondary | ICD-10-CM | POA: Diagnosis not present

## 2023-05-27 DIAGNOSIS — D631 Anemia in chronic kidney disease: Secondary | ICD-10-CM | POA: Diagnosis not present

## 2023-05-27 DIAGNOSIS — N184 Chronic kidney disease, stage 4 (severe): Secondary | ICD-10-CM | POA: Diagnosis not present

## 2023-05-27 DIAGNOSIS — E1122 Type 2 diabetes mellitus with diabetic chronic kidney disease: Secondary | ICD-10-CM | POA: Diagnosis not present

## 2023-05-27 DIAGNOSIS — C61 Malignant neoplasm of prostate: Secondary | ICD-10-CM | POA: Diagnosis not present

## 2023-05-27 DIAGNOSIS — I129 Hypertensive chronic kidney disease with stage 1 through stage 4 chronic kidney disease, or unspecified chronic kidney disease: Secondary | ICD-10-CM | POA: Diagnosis not present

## 2023-05-27 DIAGNOSIS — M109 Gout, unspecified: Secondary | ICD-10-CM | POA: Diagnosis not present

## 2023-05-27 DIAGNOSIS — N189 Chronic kidney disease, unspecified: Secondary | ICD-10-CM | POA: Diagnosis not present

## 2023-05-30 ENCOUNTER — Other Ambulatory Visit: Payer: Self-pay | Admitting: Internal Medicine

## 2023-05-30 DIAGNOSIS — N2889 Other specified disorders of kidney and ureter: Secondary | ICD-10-CM

## 2023-06-01 DIAGNOSIS — H524 Presbyopia: Secondary | ICD-10-CM | POA: Diagnosis not present

## 2023-06-01 DIAGNOSIS — E119 Type 2 diabetes mellitus without complications: Secondary | ICD-10-CM | POA: Diagnosis not present

## 2023-06-16 DIAGNOSIS — R748 Abnormal levels of other serum enzymes: Secondary | ICD-10-CM | POA: Diagnosis not present

## 2023-06-23 DIAGNOSIS — D649 Anemia, unspecified: Secondary | ICD-10-CM | POA: Diagnosis not present

## 2023-06-23 DIAGNOSIS — C61 Malignant neoplasm of prostate: Secondary | ICD-10-CM | POA: Diagnosis not present

## 2023-06-23 DIAGNOSIS — E1136 Type 2 diabetes mellitus with diabetic cataract: Secondary | ICD-10-CM | POA: Diagnosis not present

## 2023-06-23 DIAGNOSIS — I10 Essential (primary) hypertension: Secondary | ICD-10-CM | POA: Diagnosis not present

## 2023-06-23 DIAGNOSIS — E0859 Diabetes mellitus due to underlying condition with other circulatory complications: Secondary | ICD-10-CM | POA: Diagnosis not present

## 2023-06-23 DIAGNOSIS — E559 Vitamin D deficiency, unspecified: Secondary | ICD-10-CM | POA: Diagnosis not present

## 2023-06-30 DIAGNOSIS — C61 Malignant neoplasm of prostate: Secondary | ICD-10-CM | POA: Diagnosis not present

## 2023-07-03 ENCOUNTER — Other Ambulatory Visit: Payer: HMO

## 2023-07-07 DIAGNOSIS — N184 Chronic kidney disease, stage 4 (severe): Secondary | ICD-10-CM | POA: Diagnosis not present

## 2023-07-07 DIAGNOSIS — N5201 Erectile dysfunction due to arterial insufficiency: Secondary | ICD-10-CM | POA: Diagnosis not present

## 2023-07-07 DIAGNOSIS — C61 Malignant neoplasm of prostate: Secondary | ICD-10-CM | POA: Diagnosis not present

## 2023-08-04 DIAGNOSIS — Z79899 Other long term (current) drug therapy: Secondary | ICD-10-CM | POA: Diagnosis not present

## 2023-08-21 ENCOUNTER — Ambulatory Visit
Admission: RE | Admit: 2023-08-21 | Discharge: 2023-08-21 | Disposition: A | Discharge status: Home / Self Care | Payer: PPO | Source: Ambulatory Visit | Attending: Internal Medicine | Admitting: Internal Medicine

## 2023-08-21 DIAGNOSIS — N2889 Other specified disorders of kidney and ureter: Secondary | ICD-10-CM

## 2023-08-21 DIAGNOSIS — N281 Cyst of kidney, acquired: Secondary | ICD-10-CM | POA: Diagnosis not present

## 2023-08-21 MED ORDER — GADOPICLENOL 0.5 MMOL/ML IV SOLN
9.0000 mL | Freq: Once | INTRAVENOUS | Status: AC | PRN
  Administered 2023-08-21: 9 mL via INTRAVENOUS

## 2023-08-24 DIAGNOSIS — N184 Chronic kidney disease, stage 4 (severe): Secondary | ICD-10-CM | POA: Diagnosis not present

## 2023-08-24 DIAGNOSIS — E872 Acidosis, unspecified: Secondary | ICD-10-CM | POA: Diagnosis not present

## 2023-09-01 DIAGNOSIS — E1122 Type 2 diabetes mellitus with diabetic chronic kidney disease: Secondary | ICD-10-CM | POA: Diagnosis not present

## 2023-09-01 DIAGNOSIS — N2889 Other specified disorders of kidney and ureter: Secondary | ICD-10-CM | POA: Diagnosis not present

## 2023-09-01 DIAGNOSIS — D631 Anemia in chronic kidney disease: Secondary | ICD-10-CM | POA: Diagnosis not present

## 2023-09-01 DIAGNOSIS — E872 Acidosis, unspecified: Secondary | ICD-10-CM | POA: Diagnosis not present

## 2023-09-01 DIAGNOSIS — N184 Chronic kidney disease, stage 4 (severe): Secondary | ICD-10-CM | POA: Diagnosis not present

## 2023-09-01 DIAGNOSIS — E559 Vitamin D deficiency, unspecified: Secondary | ICD-10-CM | POA: Diagnosis not present

## 2023-09-01 DIAGNOSIS — M109 Gout, unspecified: Secondary | ICD-10-CM | POA: Diagnosis not present

## 2023-09-01 DIAGNOSIS — I129 Hypertensive chronic kidney disease with stage 1 through stage 4 chronic kidney disease, or unspecified chronic kidney disease: Secondary | ICD-10-CM | POA: Diagnosis not present

## 2023-09-02 DIAGNOSIS — H401131 Primary open-angle glaucoma, bilateral, mild stage: Secondary | ICD-10-CM | POA: Diagnosis not present

## 2023-09-06 ENCOUNTER — Other Ambulatory Visit: Payer: Self-pay | Admitting: Urology

## 2023-09-06 DIAGNOSIS — D4102 Neoplasm of uncertain behavior of left kidney: Secondary | ICD-10-CM

## 2023-09-12 DIAGNOSIS — C61 Malignant neoplasm of prostate: Secondary | ICD-10-CM | POA: Diagnosis not present

## 2023-09-12 DIAGNOSIS — D49511 Neoplasm of unspecified behavior of right kidney: Secondary | ICD-10-CM | POA: Diagnosis not present

## 2023-11-14 ENCOUNTER — Encounter (HOSPITAL_COMMUNITY): Payer: Self-pay

## 2023-11-14 ENCOUNTER — Other Ambulatory Visit: Payer: Self-pay

## 2023-11-14 ENCOUNTER — Inpatient Hospital Stay (HOSPITAL_COMMUNITY)
Admission: EM | Admit: 2023-11-14 | Discharge: 2023-11-17 | DRG: 378 | Disposition: A | Discharge status: Home / Self Care | Payer: PPO | Attending: Internal Medicine | Admitting: Internal Medicine

## 2023-11-14 DIAGNOSIS — E785 Hyperlipidemia, unspecified: Secondary | ICD-10-CM | POA: Diagnosis present

## 2023-11-14 DIAGNOSIS — E1122 Type 2 diabetes mellitus with diabetic chronic kidney disease: Secondary | ICD-10-CM | POA: Diagnosis present

## 2023-11-14 DIAGNOSIS — N1832 Chronic kidney disease, stage 3b: Secondary | ICD-10-CM | POA: Diagnosis present

## 2023-11-14 DIAGNOSIS — K644 Residual hemorrhoidal skin tags: Secondary | ICD-10-CM | POA: Diagnosis present

## 2023-11-14 DIAGNOSIS — K921 Melena: Secondary | ICD-10-CM | POA: Diagnosis present

## 2023-11-14 DIAGNOSIS — D62 Acute posthemorrhagic anemia: Secondary | ICD-10-CM | POA: Diagnosis present

## 2023-11-14 DIAGNOSIS — B9681 Helicobacter pylori [H. pylori] as the cause of diseases classified elsewhere: Secondary | ICD-10-CM | POA: Diagnosis present

## 2023-11-14 DIAGNOSIS — D649 Anemia, unspecified: Secondary | ICD-10-CM

## 2023-11-14 DIAGNOSIS — E1100 Type 2 diabetes mellitus with hyperosmolarity without nonketotic hyperglycemic-hyperosmolar coma (NKHHC): Secondary | ICD-10-CM

## 2023-11-14 DIAGNOSIS — Z8249 Family history of ischemic heart disease and other diseases of the circulatory system: Secondary | ICD-10-CM

## 2023-11-14 DIAGNOSIS — Z8546 Personal history of malignant neoplasm of prostate: Secondary | ICD-10-CM

## 2023-11-14 DIAGNOSIS — M109 Gout, unspecified: Secondary | ICD-10-CM | POA: Diagnosis present

## 2023-11-14 DIAGNOSIS — Z79899 Other long term (current) drug therapy: Secondary | ICD-10-CM

## 2023-11-14 DIAGNOSIS — Z7982 Long term (current) use of aspirin: Secondary | ICD-10-CM

## 2023-11-14 DIAGNOSIS — K2970 Gastritis, unspecified, without bleeding: Secondary | ICD-10-CM | POA: Diagnosis not present

## 2023-11-14 DIAGNOSIS — Z923 Personal history of irradiation: Secondary | ICD-10-CM

## 2023-11-14 DIAGNOSIS — I959 Hypotension, unspecified: Secondary | ICD-10-CM | POA: Diagnosis present

## 2023-11-14 DIAGNOSIS — K922 Gastrointestinal hemorrhage, unspecified: Secondary | ICD-10-CM | POA: Diagnosis not present

## 2023-11-14 DIAGNOSIS — E1165 Type 2 diabetes mellitus with hyperglycemia: Secondary | ICD-10-CM | POA: Diagnosis present

## 2023-11-14 DIAGNOSIS — I129 Hypertensive chronic kidney disease with stage 1 through stage 4 chronic kidney disease, or unspecified chronic kidney disease: Secondary | ICD-10-CM | POA: Diagnosis present

## 2023-11-14 DIAGNOSIS — K5731 Diverticulosis of large intestine without perforation or abscess with bleeding: Secondary | ICD-10-CM | POA: Diagnosis present

## 2023-11-14 DIAGNOSIS — K3189 Other diseases of stomach and duodenum: Secondary | ICD-10-CM | POA: Diagnosis present

## 2023-11-14 DIAGNOSIS — E872 Acidosis, unspecified: Secondary | ICD-10-CM | POA: Diagnosis present

## 2023-11-14 DIAGNOSIS — K648 Other hemorrhoids: Secondary | ICD-10-CM | POA: Diagnosis present

## 2023-11-14 DIAGNOSIS — K2951 Unspecified chronic gastritis with bleeding: Secondary | ICD-10-CM | POA: Diagnosis present

## 2023-11-14 DIAGNOSIS — N179 Acute kidney failure, unspecified: Secondary | ICD-10-CM | POA: Diagnosis present

## 2023-11-14 DIAGNOSIS — E871 Hypo-osmolality and hyponatremia: Secondary | ICD-10-CM | POA: Diagnosis present

## 2023-11-14 DIAGNOSIS — K5730 Diverticulosis of large intestine without perforation or abscess without bleeding: Secondary | ICD-10-CM | POA: Diagnosis not present

## 2023-11-14 DIAGNOSIS — E119 Type 2 diabetes mellitus without complications: Secondary | ICD-10-CM | POA: Diagnosis not present

## 2023-11-14 DIAGNOSIS — I10 Essential (primary) hypertension: Secondary | ICD-10-CM | POA: Diagnosis not present

## 2023-11-14 LAB — HEMOGLOBIN AND HEMATOCRIT, BLOOD
HCT: 21 % — ABNORMAL LOW (ref 39.0–52.0)
Hemoglobin: 7 g/dL — ABNORMAL LOW (ref 13.0–17.0)

## 2023-11-14 LAB — COMPREHENSIVE METABOLIC PANEL
ALT: 17 U/L (ref 0–44)
AST: 20 U/L (ref 15–41)
Albumin: 3.3 g/dL — ABNORMAL LOW (ref 3.5–5.0)
Alkaline Phosphatase: 59 U/L (ref 38–126)
Anion gap: 13 (ref 5–15)
BUN: 40 mg/dL — ABNORMAL HIGH (ref 8–23)
CO2: 17 mmol/L — ABNORMAL LOW (ref 22–32)
Calcium: 9.1 mg/dL (ref 8.9–10.3)
Chloride: 103 mmol/L (ref 98–111)
Creatinine, Ser: 2.91 mg/dL — ABNORMAL HIGH (ref 0.61–1.24)
GFR, Estimated: 22 mL/min — ABNORMAL LOW (ref >60)
Glucose, Bld: 177 mg/dL — ABNORMAL HIGH (ref 70–99)
Potassium: 3.9 mmol/L (ref 3.5–5.1)
Sodium: 133 mmol/L — ABNORMAL LOW (ref 135–145)
Total Bilirubin: 0.6 mg/dL (ref 0.0–1.2)
Total Protein: 7 g/dL (ref 6.5–8.1)

## 2023-11-14 LAB — HEMOGLOBIN A1C
Hgb A1c MFr Bld: 5.7 % — ABNORMAL HIGH (ref 4.8–5.6)
Mean Plasma Glucose: 116.89 mg/dL

## 2023-11-14 LAB — HIV ANTIBODY (ROUTINE TESTING W REFLEX): HIV Screen 4th Generation wRfx: NONREACTIVE

## 2023-11-14 LAB — CBC
HCT: 18.6 % — ABNORMAL LOW (ref 39.0–52.0)
Hemoglobin: 6 g/dL — CL (ref 13.0–17.0)
MCH: 31.6 pg (ref 26.0–34.0)
MCHC: 32.3 g/dL (ref 30.0–36.0)
MCV: 97.9 fL (ref 80.0–100.0)
Platelets: 244 10*3/uL (ref 150–400)
RBC: 1.9 MIL/uL — ABNORMAL LOW (ref 4.22–5.81)
RDW: 13.2 % (ref 11.5–15.5)
WBC: 15.2 10*3/uL — ABNORMAL HIGH (ref 4.0–10.5)
nRBC: 0.1 % (ref 0.0–0.2)

## 2023-11-14 LAB — CBG MONITORING, ED: Glucose-Capillary: 82 mg/dL (ref 70–99)

## 2023-11-14 LAB — ABO/RH: ABO/RH(D): B POS

## 2023-11-14 LAB — PREPARE RBC (CROSSMATCH)

## 2023-11-14 MED ORDER — ACETAMINOPHEN 325 MG PO TABS: 650.0000 mg | ORAL_TABLET | Freq: Four times a day (QID) | ORAL | Status: DC | PRN

## 2023-11-14 MED ORDER — ACETAMINOPHEN 650 MG RE SUPP: 650.0000 mg | Freq: Four times a day (QID) | RECTAL | Status: DC | PRN

## 2023-11-14 MED ORDER — LACTATED RINGERS IV BOLUS
1000.0000 mL | Freq: Once | INTRAVENOUS | Status: AC
  Administered 2023-11-14: 1000 mL via INTRAVENOUS

## 2023-11-14 MED ORDER — ROSUVASTATIN CALCIUM 5 MG PO TABS
5.0000 mg | ORAL_TABLET | Freq: Every day | ORAL | Status: DC
  Administered 2023-11-15 – 2023-11-17 (×3): 5 mg via ORAL
  Filled 2023-11-14 (×3): qty 1

## 2023-11-14 MED ORDER — PEG 3350-KCL-NA BICARB-NACL 420 G PO SOLR
4000.0000 mL | Freq: Once | ORAL | Status: AC
  Administered 2023-11-14: 4000 mL via ORAL
  Filled 2023-11-14: qty 4000

## 2023-11-14 MED ORDER — SODIUM CHLORIDE 0.9% IV SOLUTION: Freq: Once | INTRAVENOUS | Status: AC

## 2023-11-14 NOTE — H&P (Signed)
History and Physical    Patient: Jorge Decker:[id number] DOB: [birth year] DOA: 11/14/2023 DOS: the patient was seen and examined on 11/14/2023 PCP: [facility]  Patient coming from: Home  Chief Complaint:  Chief Complaint  Patient presents with   Blood In Stools   HPI: Jorge Decker is a 73 y.o. male with medical history significant of CKD 3B, type 2 diabetes, hypertension, history of prostate cancer status post radiotherapy presenting with rectal bleeding  He reports that he had 2 episodes of rectal bleeding last week (unclear date) but this cleared up and he had normal stools thereafter for the remainder of the week.  For those 2 episodes of bleeding, he is unsure if he was having bright red blood per rectum or dark-colored stools, thinks it may have been a combination of both.  He was doing fine until yesterday night when he again noticed 2 episodes of bleeding per rectum.  He notes that these episodes also seem to be a combination of both BRBPR and melena.  He denies any lightheadedness, dizziness, chest pain, nausea, vomiting, shortness of breath, abdominal pain, diarrhea.  He does note that he has to strain a lot when he attempts to pass a bowel movement but does not believe he is constipated every day.  He denies any history of prior bleeding per rectum.  He denies any current NSAID use.  He does report a prior history of prostate cancer back in 2017 or 2018, underwent radiation therapy with urology at that time and has since been in remission.  He does follow with urology every 6 months for follow-up and surveillance and notes that he has been doing well from the standpoint.  ED course: Initial vitals stable.  CBC with hemoglobin 6.0 and leukocytosis to 15.2.  CMP with mild hyponatremia, metabolic acidosis (chronic), hyperglycemia to 177, BUN 40, creatinine 2.91 (baseline creatinine around 2-2.1).  Blood pressure did trend down to the 80s/70s while in the ED  but improving with blood transfusion and IV fluids.  GI consulted by ED provider.  [facility] hospitalist asked to evaluate patient for admission.  Review of Systems: As mentioned in the history of present illness. All other systems reviewed and are negative. Past Medical History:  Diagnosis Date   Gout    Hyperlipidemia    Hypertension    Prostate cancer (HCC)    Type 2 diabetes mellitus (HCC)    followed by pcp--- (09-04-2019  per pt fasting surgar -- 120 and checks dialy in am)   Past Surgical History:  Procedure Laterality Date   colonscopy     MULTIPLE TOOTH EXTRACTIONS  yrs ago   PROSTATE BIOPSY     RADIOACTIVE SEED IMPLANT N/A 09/07/2019   Procedure: RADIOACTIVE SEED IMPLANT/BRACHYTHERAPY IMPLANT;  Surgeon: Crista Elliot, MD;  Location: [facility];  Service: Urology;  Laterality: N/A;   SPACE OAR INSTILLATION N/A 09/07/2019   Procedure: SPACE OAR INSTILLATION;  Surgeon: Crista Elliot, MD;  Location: [facility];  Service: Urology;  Laterality: N/A;   Social History:  reports that he has never smoked. He has never used smokeless tobacco. He reports current alcohol use. He reports that he does not use drugs.  No Known Allergies  Family History  Problem Relation Age of Onset   Hypertension Mother    Cancer Mother        unknown   Heart failure Sister    Lung cancer Brother  smoker   Diabetes Neg Hx    Autoimmune disease Neg Hx    Prostate cancer Neg Hx    Breast cancer Neg Hx    Colon cancer Neg Hx     Prior to Admission medications   Medication Sig Start Date End Date Taking? Authorizing Provider  allopurinol (ZYLOPRIM) 300 MG tablet Take 300 mg by mouth 3 (three) times a week. 05/21/16  Yes [provider]  amLODipine (NORVASC) 10 MG tablet Take 10 mg by mouth daily. 01/31/19  Yes [provider]  aspirin EC 81 MG tablet Take 81 mg by mouth daily.   Yes [provider]  carvedilol (COREG) 6.25 MG  tablet Take 6.25 mg by mouth 2 (two) times daily. 08/08/23  Yes [provider]  Cholecalciferol (VITAMIN D3) 1000 units CAPS Take 2,000 Units by mouth daily.   Yes [provider]  rosuvastatin (CRESTOR) 5 MG tablet Take 5 mg by mouth daily. 02/28/19  Yes [provider]  sodium bicarbonate 650 MG tablet Take 650 mg by mouth 2 (two) times daily. 11/03/23  Yes [provider]  telmisartan (MICARDIS) 40 MG tablet Take 40 mg by mouth daily. 11/03/23  Yes [provider]  vitamin B-12 (CYANOCOBALAMIN) 500 MCG tablet Take 500 mcg by mouth daily.   Yes [provider]  blood glucose meter kit and supplies KIT Dispense based on patient and insurance preference. Use up to four times daily as directed. (FOR ICD-9 250.00, 250.01). 06/10/16   Clydia Llano, MD    Physical Exam: Vitals:   11/14/23 8657 11/14/23 0626 11/14/23 0652 11/14/23 0707  BP: 90/63  (!) 87/72 104/73  Pulse: 78  82 79  Resp: 15  20 (!) 22  Temp:  (!) 96.8 F (36 C) (!) 96.5 F (35.8 C) (!) 96.7 F (35.9 C)  TempSrc:  Temporal Temporal Temporal  SpO2: 100%     Weight:      Height:       Physical Exam Constitutional:      Appearance: Normal appearance. He is not ill-appearing.  HENT:     Head: Normocephalic and atraumatic.     Mouth/Throat:     Mouth: Mucous membranes are dry.     Pharynx: Oropharynx is clear. No oropharyngeal exudate.  Eyes:     General: No scleral icterus.    Extraocular Movements: Extraocular movements intact.     Pupils: Pupils are equal, round, and reactive to light.  Cardiovascular:     Rate and Rhythm: Normal rate and regular rhythm.     Pulses: Normal pulses.     Heart sounds: Normal heart sounds. No murmur heard.    No friction rub. No gallop.  Pulmonary:     Effort: Pulmonary effort is normal.     Breath sounds: Normal breath sounds. No wheezing, rhonchi or rales.  Abdominal:     General: Bowel sounds are normal. There is no distension.      Palpations: Abdomen is soft.     Tenderness: There is no guarding or rebound.     Comments: TTP in LLQ  Musculoskeletal:        General: No swelling. Normal range of motion.     Cervical back: Normal range of motion.  Skin:    General: Skin is warm and dry.  Neurological:     General: No focal deficit present.     Mental Status: He is alert and oriented to person, place, and time.  Psychiatric:  Mood and Affect: Mood normal.        Behavior: Behavior normal.     Data Reviewed:  There are no new results to review at this time.     Latest Ref Rng & Units 11/14/2023    5:09 AM 09/06/2019    9:01 AM 07/26/2019    8:51 AM  CBC  WBC 4.0 - 10.5 K/uL 15.2  7.9  6.4   Hemoglobin 13.0 - 17.0 g/dL 6.0  16.1  09.6   Hematocrit 39.0 - 52.0 % 18.6  32.3  32.7   Platelets 150 - 400 K/uL 244  170  144       Latest Ref Rng & Units 11/14/2023    5:09 AM 09/06/2019    9:01 AM 07/26/2019    8:51 AM  CMP  Glucose 70 - 99 mg/dL 045  409  811   BUN 8 - 23 mg/dL 40  29  40   Creatinine 0.61 - 1.24 mg/dL 9.14  7.82  9.56   Sodium 135 - 145 mmol/L 133  135  131   Potassium 3.5 - 5.1 mmol/L 3.9  4.7  5.3   Chloride 98 - 111 mmol/L 103  107  105   CO2 22 - 32 mmol/L 17  19  17    Calcium 8.9 - 10.3 mg/dL 9.1  9.0  8.9   Total Protein 6.5 - 8.1 g/dL 7.0  8.1  7.9   Total Bilirubin 0.0 - 1.2 mg/dL 0.6  0.3  0.3   Alkaline Phos 38 - 126 U/L 59  71  72   AST 15 - 41 U/L 20  49  49   ALT 0 - 44 U/L 17  40  35     Assessment and Plan: No notes have been filed under this hospital service. Service: Hospitalist  Acute GI bleed Acute blood loss anemia Most likely diverticular bleed versus hemorrhoids per GI.  Hemoglobin down to 6, status post 1 unit blood transfusion.  GI has been consulted, planning for endoscopy and colonoscopy tomorrow morning.  He had a colonoscopy in 2015 with Dr. Laural Benes which was normal, repeat recommended in 10 years from that time.  Will provide IV fluid rehydration  as labs may indicate possible hemoconcentration. -GI consulted, appreciate assistance -Status post 1 unit PRBC -1 L LR for IV fluid rehydration -Trend hemoglobin curve, H&H tonight -Transfuse if hemoglobin less than 7 and/or hemodynamic instability -Clear liquid diet, NPO at midnight in anticipation for EGD and colonoscopy tomorrow morning -EGD and colonoscopy tomorrow morning -Admit to inpatient/progressive  AKI on CKD 3B Baseline creatinine appears to be around 2.0-2.1, consistent with CKD 3B.  On admission creatinine 2.91 with GFR 22.  Likely has a prerenal insult given acute GI bleed and acute blood loss anemia.  Will provide IV fluid rehydration and he is status post 1 unit PRBC. -1 L IV fluid -Status post 1 unit PRBC -Trend kidney function -Monitor urine output -Avoid nephrotoxic agents as able  Hypotension History of hypertension Patient currently hypotensive in the setting of acute GI bleed.  BP trended down to 87/72 while in the ED, has improved to 106/74 with IV fluids and blood transfusion.  He reports taking Norvasc 10 mg daily, Coreg 6.25 mg twice daily, telmisartan 40 mg daily at home but has not taken any today.  Will hold his home antihypertensives given current hypotension and AKI. -Holding blood pressure medications given hypotension -IV fluids -Blood transfusion as noted above  T2DM -Reports  that diabetes has been well-controlled and he is no longer on any medications for this -A1c 5.7% today -Random blood glucose 177 on CMP, likely elevated in the setting of current illness -Trend CBGs, goal 140-180 -Can start SSI if CBGs above goal  Hx of prostate cancer s/p radiation therapy -In remission -Follows urology every 6 months   Advance Care Planning:   Code Status: Full Code   Consults: GI  Family Communication: updated   Severity of Illness: The appropriate patient status for this patient is INPATIENT. Inpatient status is judged to be reasonable and  necessary in order to provide the required intensity of service to ensure the patient's safety. The patient's presenting symptoms, physical exam findings, and initial radiographic and laboratory data in the context of their chronic comorbidities is felt to place them at high risk for further clinical deterioration. Furthermore, it is not anticipated that the patient will be medically stable for discharge from the hospital within 2 midnights of admission.   * I certify that at the point of admission it is my clinical judgment that the patient will require inpatient hospital care spanning beyond 2 midnights from the point of admission due to high intensity of service, high risk for further deterioration and high frequency of surveillance required.*  Portions of this note were generated with Dragon dictation software. Dictation errors may occur despite best attempts at proofreading.   Author: Briscoe Burns, MD 11/14/2023 7:44 AM  For on call review [email or web address].

## 2023-11-14 NOTE — ED Notes (Signed)
Patient signed consent form for blood transfusion , EDP explained plan of care /blood transfusion to patient at bedside .

## 2023-11-14 NOTE — ED Notes (Signed)
PT up to bathroom with stable gait.  

## 2023-11-14 NOTE — H&P (View-Only) (Signed)
Referring Provider: TH Primary Care Physician:  [facility] Primary Gastroenterologist: Deboraha Sprang PCP  Reason for Consultation: GI bleed  HPI: Jorge Decker is a 73 y.o. male with past medical history of prostate cancer status post radioactive seeds placement in 2020, hyperlipidemia, diabetes, chronic kidney disease presented to the hospital with rectal bleeding. Upon initial evaluation, was found to have significant drop in hemoglobin to 6 from baseline 10.  Normal platelet counts.  Normal LFTs.  GI is consulted for further evaluation.  He had few episodes of rectal bleeding last week which subsequently resolved.  Few days later he started having rectal bleeding again.  Described as bright red blood per rectum as well as dark clots in the stool mostly with a bowel movement.  Had some left lower quadrant discomfort which has resolved now.  Denies any abdominal pain, nausea or vomiting.  Denies any diarrhea or constipation.  Denies any history of bleeding in the past.  Denies NSAID use.  Alcohol on a regular basis.   Records from [facility] reviewed.  Blood work in July 2024 showed mild anemia with hemoglobin of 10.3.  Colonoscopy in February 2015 by Dr. Laural Benes was normal and repeat was recommended in 10 years.  MRI abdomen November 2024 for evaluation of renal lesion showed Bosniak category 3 right kidney lesion.  Past Medical History:  Diagnosis Date   Gout    Hyperlipidemia    Hypertension    Prostate cancer (HCC)    Type 2 diabetes mellitus (HCC)    followed by pcp--- (09-04-2019  per pt fasting surgar -- 120 and checks dialy in am)    Past Surgical History:  Procedure Laterality Date   colonscopy     MULTIPLE TOOTH EXTRACTIONS  yrs ago   PROSTATE BIOPSY     RADIOACTIVE SEED IMPLANT N/A 09/07/2019   Procedure: RADIOACTIVE SEED IMPLANT/BRACHYTHERAPY IMPLANT;  Surgeon: Crista Elliot, MD;  Location: [facility];  Service: Urology;   Laterality: N/A;   SPACE OAR INSTILLATION N/A 09/07/2019   Procedure: SPACE OAR INSTILLATION;  Surgeon: Crista Elliot, MD;  Location: [facility];  Service: Urology;  Laterality: N/A;    Prior to Admission medications   Medication Sig Start Date End Date Taking? Authorizing Provider  allopurinol (ZYLOPRIM) 300 MG tablet Take 300 mg by mouth 3 (three) times a week. 05/21/16  Yes [provider]  amLODipine (NORVASC) 10 MG tablet Take 10 mg by mouth daily. 01/31/19  Yes [provider]  aspirin EC 81 MG tablet Take 81 mg by mouth daily.   Yes [provider]  carvedilol (COREG) 6.25 MG tablet Take 6.25 mg by mouth 2 (two) times daily. 08/08/23  Yes [provider]  Cholecalciferol (VITAMIN D3) 1000 units CAPS Take 2,000 Units by mouth daily.   Yes [provider]  rosuvastatin (CRESTOR) 5 MG tablet Take 5 mg by mouth daily. 02/28/19  Yes [provider]  sodium bicarbonate 650 MG tablet Take 650 mg by mouth 2 (two) times daily. 11/03/23  Yes [provider]  telmisartan (MICARDIS) 40 MG tablet Take 40 mg by mouth daily. 11/03/23  Yes [provider]  vitamin B-12 (CYANOCOBALAMIN) 500 MCG tablet Take 500 mcg by mouth daily.   Yes [provider]  blood glucose meter kit and supplies KIT Dispense based on patient and insurance preference. Use up to four times daily as directed. (FOR ICD-9 250.00, 250.01). 06/10/16   Clydia Llano,  MD    Scheduled Meds: Continuous Infusions: PRN Meds:.acetaminophen **OR** acetaminophen  Allergies as of 11/14/2023   (No Known Allergies)    Family History  Problem Relation Age of Onset   Hypertension Mother    Cancer Mother        unknown   Heart failure Sister    Lung cancer Brother        smoker   Diabetes Neg Hx    Autoimmune disease Neg Hx    Prostate cancer Neg Hx    Breast cancer Neg Hx    Colon cancer Neg Hx     Social History   Socioeconomic History    Marital status: Single    Spouse name: Not on file   Number of children: 6   Years of education: Not on file   Highest education level: Not on file  Occupational History   Not on file  Tobacco Use   Smoking status: Never   Smokeless tobacco: Never  Vaping Use   Vaping status: Never Used  Substance and Sexual Activity   Alcohol use: Yes    Comment: 4 shots  per day   Drug use: No   Sexual activity: Yes    Comment: mild ED responsive to Cialis  Other Topics Concern   Not on file  Social History Narrative   Not on file   Social Drivers of Health   Financial Resource Strain: Not on file  Food Insecurity: No Food Insecurity (05/06/2020)   Hunger Vital Sign    Worried About Running Out of Food in the Last Year: Never true    Ran Out of Food in the Last Year: Never true  Transportation Needs: No Transportation Needs (05/06/2020)   PRAPARE - Administrator, Civil Service (Medical): No    Lack of Transportation (Non-Medical): No  Physical Activity: Not on file  Stress: Not on file  Social Connections: Not on file  Intimate Partner Violence: Not on file    Review of Systems: All negative except as stated above in HPI.  Physical Exam: Vital signs: Vitals:   11/14/23 0652 11/14/23 0707  BP: (!) 87/72 104/73  Pulse: 82 79  Resp: 20 (!) 22  Temp: (!) 96.5 F (35.8 C) (!) 96.7 F (35.9 C)  SpO2:       General:   Alert,  Well-developed, well-nourished, pleasant and cooperative in NAD Lungs:  Clear throughout to auscultation.   No wheezes, crackles, or rhonchi. No acute distress. Heart:  Regular rate and rhythm; no murmurs, clicks, rubs,  or gallops. Abdomen: Soft, nontender, nondistended, bowel sound present, no peritoneal signs Mood and affect normal Alert and oriented x 3 Rectal:  Deferred  GI:  Lab Results: Recent Labs    11/14/23 0509  WBC 15.2*  HGB 6.0*  HCT 18.6*  PLT 244   BMET Recent Labs    11/14/23 0509  NA 133*  K 3.9  CL 103   CO2 17*  GLUCOSE 177*  BUN 40*  CREATININE 2.91*  CALCIUM 9.1   LFT Recent Labs    11/14/23 0509  PROT 7.0  ALBUMIN 3.3*  AST 20  ALT 17  ALKPHOS 59  BILITOT 0.6   PT/INR No results for input(s): "LABPROT", "INR" in the last 72 hours.   Studies/Results: No results found.  Impression/Plan: -Rectal bleeding.  Most likely diverticular bleed versus hemorrhoids. -Dark stool.  Likely blood clot.  Need to rule out upper GI bleed given significant drop in hemoglobin. -  Acute blood loss anemia.  Hemoglobin down to 6.  S/p 1 unit of blood transfusion.  Hemoglobin may be less than 6 as he appears hemoconcentrated based on elevated creatinine and elevated white counts. -Acute on chronic anemia -Acute on chronic kidney disease  Recommendations ------------------------- -Patient is status post 1 unit of blood transfusion.  Recommend to check H&H in the evening.  May need another unit of blood transfusion if hemoglobin remains less than 7.  Discussed with Dr. Austin Miles.  -Plan for EGD and colonoscopy tomorrow morning.  Okay to have clear liquid diet today.  Keep n.p.o. past midnight.  Risks (bleeding, infection, bowel perforation that could require surgery, sedation-related changes in cardiopulmonary systems), benefits (identification and possible treatment of source of symptoms, exclusion of certain causes of symptoms), and alternatives (watchful waiting, radiographic imaging studies, empiric medical treatment)  were explained to patient/family in detail and patient wishes to proceed.    LOS: 0 days   Kathi Der  MD, FACP 11/14/2023, 7:46 AM  Contact #  [phone]

## 2023-11-14 NOTE — Progress Notes (Signed)
Report given to [facility].  Pt is in no apparent new onset distress at this time

## 2023-11-14 NOTE — ED Notes (Signed)
Patient ambulatory to restroom unassisted

## 2023-11-14 NOTE — ED Provider Notes (Signed)
[place] EMERGENCY DEPARTMENT AT [facility] Provider Note   CSN: [id number] Arrival date & time: 11/14/23  0450     History  Chief Complaint  Patient presents with   Blood In Stools    Jorge Decker is a 73 y.o. male.  73 year old male presents ER today with bloody stools.  Patient states that he started to have bloody stools on Wednesday with dark blood and bright red blood for a few days but then it got better.  He states Saturday with no bloody stools and Sunday with no bloody stools but then he woke up tonight and had 3 episodes again so he decided he should come get checked out.  Little bit weak and mildly lightheaded but no other significant symptoms.  Patient states has not had GI bleeding in the past.  Denies any anticoagulants.  States he sees [place] for GI care however does not remember when his last colonoscopy was.  No known GI malignancies.        Home Medications Prior to Admission medications   Medication Sig Start Date End Date Taking? Authorizing Provider  allopurinol (ZYLOPRIM) 300 MG tablet Take 300 mg by mouth 3 (three) times a week. 05/21/16  Yes [provider]  amLODipine (NORVASC) 10 MG tablet Take 10 mg by mouth daily. 01/31/19  Yes [provider]  aspirin EC 81 MG tablet Take 81 mg by mouth daily.   Yes [provider]  carvedilol (COREG) 6.25 MG tablet Take 6.25 mg by mouth 2 (two) times daily. 08/08/23  Yes [provider]  Cholecalciferol (VITAMIN D3) 1000 units CAPS Take 2,000 Units by mouth daily.   Yes [provider]  rosuvastatin (CRESTOR) 5 MG tablet Take 5 mg by mouth daily. 02/28/19  Yes [provider]  sodium bicarbonate 650 MG tablet Take 650 mg by mouth 2 (two) times daily. 11/03/23  Yes [provider]  telmisartan (MICARDIS) 40 MG tablet Take 40 mg by mouth daily. 11/03/23  Yes [provider]  vitamin B-12 (CYANOCOBALAMIN) 500 MCG tablet Take 500 mcg by mouth  daily.   Yes [provider]  blood glucose meter kit and supplies KIT Dispense based on patient and insurance preference. Use up to four times daily as directed. (FOR ICD-9 250.00, 250.01). 06/10/16   Clydia Llano, MD      Allergies    Patient has no known allergies.    Review of Systems   Review of Systems  Physical Exam Updated Vital Signs BP 104/73   Pulse 79   Temp (!) 96.7 F (35.9 C) (Temporal)   Resp (!) 22   Ht 5\' 6"  (1.676 m)   Wt 70.8 kg   SpO2 100%   BMI 25.18 kg/m  Physical Exam Vitals and nursing note reviewed.  Constitutional:      Appearance: He is well-developed.  HENT:     Head: Normocephalic and atraumatic.  Eyes:     Comments: Pale conjunctiva  Cardiovascular:     Rate and Rhythm: Normal rate.  Pulmonary:     Effort: Pulmonary effort is normal. No respiratory distress.  Abdominal:     General: There is no distension.  Musculoskeletal:        General: Normal range of motion.     Cervical back: Normal range of motion.  Skin:    General: Skin is warm and dry.  Neurological:     General: No focal deficit present.     Mental Status: He  is alert.     ED Results / Procedures / Treatments   Labs (all labs ordered are listed, but only abnormal results are displayed) Labs Reviewed  COMPREHENSIVE METABOLIC PANEL - Abnormal; Notable for the following components:      Result Value   Sodium 133 (*)    CO2 17 (*)    Glucose, Bld 177 (*)    BUN 40 (*)    Creatinine, Ser 2.91 (*)    Albumin 3.3 (*)    GFR, Estimated 22 (*)    All other components within normal limits  CBC - Abnormal; Notable for the following components:   WBC 15.2 (*)    RBC 1.90 (*)    Hemoglobin 6.0 (*)    HCT 18.6 (*)    All other components within normal limits  POC OCCULT BLOOD, ED  TYPE AND SCREEN  ABO/RH  PREPARE RBC (CROSSMATCH)    EKG None  Radiology No results found.  Procedures .Critical Care  Performed by: Marily Memos, MD Authorized by:  Marily Memos, MD   Critical care provider statement:    Critical care time (minutes):  30   Critical care was necessary to treat or prevent imminent or life-threatening deterioration of the following conditions:  Circulatory failure   Critical care was time spent personally by me on the following activities:  Development of treatment plan with patient or surrogate, discussions with consultants, evaluation of patient's response to treatment, examination of patient, ordering and review of laboratory studies, ordering and review of radiographic studies, ordering and performing treatments and interventions, pulse oximetry, re-evaluation of patient's condition and review of old charts     Medications Ordered in ED Medications  0.9 %  sodium chloride infusion (Manually program via Guardrails IV Fluids) ( Intravenous New Bag/Given 11/14/23 0981)    ED Course/ Medical Decision Making/ A&P                                 Medical Decision Making Amount and/or Complexity of Data Reviewed Labs: ordered.  Risk Prescription drug management. Decision regarding hospitalization.   73 year old male with GI bleed. Stable. Abdomen benign. GI consulted per institutional protocol and will see in the a.m.  Discussed with Dr. Julian Reil with hospitalist for admission.  Final Clinical Impression(s) / ED Diagnoses Final diagnoses:  Blood in stool  Anemia, unspecified type    Rx / DC Orders ED Discharge Orders     None         Shaida Route, Barbara Cower, MD 11/14/23 [phone]

## 2023-11-14 NOTE — ED Notes (Signed)
1st unit PRBC started at 150 ml/hr , respirations unlabored , afebrile , IV site unremarkable .

## 2023-11-14 NOTE — ED Notes (Signed)
1st unit PRBC infusing at 250 ml/hr. , no adverse reaction , afebrile , respirations unlabored /IV site unremarkable .

## 2023-11-14 NOTE — ED Triage Notes (Signed)
Pt c/o blood in stool that started last week. Pt states he thought it had gone away and then he has had 3 episodes today. Pt describes blood as bright and dark red. Pt states he has had to strain some for bowel movements. Pt denies abdominal pain, nausea, vomiting or diarrhea.

## 2023-11-14 NOTE — Consult Note (Signed)
Referring Provider: TH Primary Care Physician:  [facility] Primary Gastroenterologist: Deboraha Sprang PCP  Reason for Consultation: GI bleed  HPI: Jorge Decker is a 73 y.o. male with past medical history of prostate cancer status post radioactive seeds placement in 2020, hyperlipidemia, diabetes, chronic kidney disease presented to the hospital with rectal bleeding. Upon initial evaluation, was found to have significant drop in hemoglobin to 6 from baseline 10.  Normal platelet counts.  Normal LFTs.  GI is consulted for further evaluation.  He had few episodes of rectal bleeding last week which subsequently resolved.  Few days later he started having rectal bleeding again.  Described as bright red blood per rectum as well as dark clots in the stool mostly with a bowel movement.  Had some left lower quadrant discomfort which has resolved now.  Denies any abdominal pain, nausea or vomiting.  Denies any diarrhea or constipation.  Denies any history of bleeding in the past.  Denies NSAID use.  Alcohol on a regular basis.   Records from [facility] reviewed.  Blood work in July 2024 showed mild anemia with hemoglobin of 10.3.  Colonoscopy in February 2015 by Dr. Laural Benes was normal and repeat was recommended in 10 years.  MRI abdomen November 2024 for evaluation of renal lesion showed Bosniak category 3 right kidney lesion.  Past Medical History:  Diagnosis Date   Gout    Hyperlipidemia    Hypertension    Prostate cancer (HCC)    Type 2 diabetes mellitus (HCC)    followed by pcp--- (09-04-2019  per pt fasting surgar -- 120 and checks dialy in am)    Past Surgical History:  Procedure Laterality Date   colonscopy     MULTIPLE TOOTH EXTRACTIONS  yrs ago   PROSTATE BIOPSY     RADIOACTIVE SEED IMPLANT N/A 09/07/2019   Procedure: RADIOACTIVE SEED IMPLANT/BRACHYTHERAPY IMPLANT;  Surgeon: Crista Elliot, MD;  Location: [facility];  Service: Urology;   Laterality: N/A;   SPACE OAR INSTILLATION N/A 09/07/2019   Procedure: SPACE OAR INSTILLATION;  Surgeon: Crista Elliot, MD;  Location: [facility];  Service: Urology;  Laterality: N/A;    Prior to Admission medications   Medication Sig Start Date End Date Taking? Authorizing Provider  allopurinol (ZYLOPRIM) 300 MG tablet Take 300 mg by mouth 3 (three) times a week. 05/21/16  Yes [provider]  amLODipine (NORVASC) 10 MG tablet Take 10 mg by mouth daily. 01/31/19  Yes [provider]  aspirin EC 81 MG tablet Take 81 mg by mouth daily.   Yes [provider]  carvedilol (COREG) 6.25 MG tablet Take 6.25 mg by mouth 2 (two) times daily. 08/08/23  Yes [provider]  Cholecalciferol (VITAMIN D3) 1000 units CAPS Take 2,000 Units by mouth daily.   Yes [provider]  rosuvastatin (CRESTOR) 5 MG tablet Take 5 mg by mouth daily. 02/28/19  Yes [provider]  sodium bicarbonate 650 MG tablet Take 650 mg by mouth 2 (two) times daily. 11/03/23  Yes [provider]  telmisartan (MICARDIS) 40 MG tablet Take 40 mg by mouth daily. 11/03/23  Yes [provider]  vitamin B-12 (CYANOCOBALAMIN) 500 MCG tablet Take 500 mcg by mouth daily.   Yes [provider]  blood glucose meter kit and supplies KIT Dispense based on patient and insurance preference. Use up to four times daily as directed. (FOR ICD-9 250.00, 250.01). 06/10/16   Clydia Llano,  MD    Scheduled Meds: Continuous Infusions: PRN Meds:.acetaminophen **OR** acetaminophen  Allergies as of 11/14/2023   (No Known Allergies)    Family History  Problem Relation Age of Onset   Hypertension Mother    Cancer Mother        unknown   Heart failure Sister    Lung cancer Brother        smoker   Diabetes Neg Hx    Autoimmune disease Neg Hx    Prostate cancer Neg Hx    Breast cancer Neg Hx    Colon cancer Neg Hx     Social History   Socioeconomic History    Marital status: Single    Spouse name: Not on file   Number of children: 6   Years of education: Not on file   Highest education level: Not on file  Occupational History   Not on file  Tobacco Use   Smoking status: Never   Smokeless tobacco: Never  Vaping Use   Vaping status: Never Used  Substance and Sexual Activity   Alcohol use: Yes    Comment: 4 shots  per day   Drug use: No   Sexual activity: Yes    Comment: mild ED responsive to Cialis  Other Topics Concern   Not on file  Social History Narrative   Not on file   Social Drivers of Health   Financial Resource Strain: Not on file  Food Insecurity: No Food Insecurity (05/06/2020)   Hunger Vital Sign    Worried About Running Out of Food in the Last Year: Never true    Ran Out of Food in the Last Year: Never true  Transportation Needs: No Transportation Needs (05/06/2020)   PRAPARE - Administrator, Civil Service (Medical): No    Lack of Transportation (Non-Medical): No  Physical Activity: Not on file  Stress: Not on file  Social Connections: Not on file  Intimate Partner Violence: Not on file    Review of Systems: All negative except as stated above in HPI.  Physical Exam: Vital signs: Vitals:   11/14/23 0652 11/14/23 0707  BP: (!) 87/72 104/73  Pulse: 82 79  Resp: 20 (!) 22  Temp: (!) 96.5 F (35.8 C) (!) 96.7 F (35.9 C)  SpO2:       General:   Alert,  Well-developed, well-nourished, pleasant and cooperative in NAD Lungs:  Clear throughout to auscultation.   No wheezes, crackles, or rhonchi. No acute distress. Heart:  Regular rate and rhythm; no murmurs, clicks, rubs,  or gallops. Abdomen: Soft, nontender, nondistended, bowel sound present, no peritoneal signs Mood and affect normal Alert and oriented x 3 Rectal:  Deferred  GI:  Lab Results: Recent Labs    11/14/23 0509  WBC 15.2*  HGB 6.0*  HCT 18.6*  PLT 244   BMET Recent Labs    11/14/23 0509  NA 133*  K 3.9  CL 103   CO2 17*  GLUCOSE 177*  BUN 40*  CREATININE 2.91*  CALCIUM 9.1   LFT Recent Labs    11/14/23 0509  PROT 7.0  ALBUMIN 3.3*  AST 20  ALT 17  ALKPHOS 59  BILITOT 0.6   PT/INR No results for input(s): "LABPROT", "INR" in the last 72 hours.   Studies/Results: No results found.  Impression/Plan: -Rectal bleeding.  Most likely diverticular bleed versus hemorrhoids. -Dark stool.  Likely blood clot.  Need to rule out upper GI bleed given significant drop in hemoglobin. -  Acute blood loss anemia.  Hemoglobin down to 6.  S/p 1 unit of blood transfusion.  Hemoglobin may be less than 6 as he appears hemoconcentrated based on elevated creatinine and elevated white counts. -Acute on chronic anemia -Acute on chronic kidney disease  Recommendations ------------------------- -Patient is status post 1 unit of blood transfusion.  Recommend to check H&H in the evening.  May need another unit of blood transfusion if hemoglobin remains less than 7.  Discussed with Dr. Austin Miles.  -Plan for EGD and colonoscopy tomorrow morning.  Okay to have clear liquid diet today.  Keep n.p.o. past midnight.  Risks (bleeding, infection, bowel perforation that could require surgery, sedation-related changes in cardiopulmonary systems), benefits (identification and possible treatment of source of symptoms, exclusion of certain causes of symptoms), and alternatives (watchful waiting, radiographic imaging studies, empiric medical treatment)  were explained to patient/family in detail and patient wishes to proceed.    LOS: 0 days   Kathi Der  MD, FACP 11/14/2023, 7:46 AM  Contact #  [phone]

## 2023-11-15 ENCOUNTER — Encounter (HOSPITAL_COMMUNITY): Payer: Self-pay | Admitting: Student

## 2023-11-15 ENCOUNTER — Inpatient Hospital Stay (HOSPITAL_COMMUNITY): Payer: Self-pay

## 2023-11-15 ENCOUNTER — Encounter (HOSPITAL_COMMUNITY)
Admission: EM | Disposition: A | Discharge status: Home / Self Care | Payer: Self-pay | Source: Home / Self Care | Attending: Internal Medicine

## 2023-11-15 DIAGNOSIS — K5730 Diverticulosis of large intestine without perforation or abscess without bleeding: Secondary | ICD-10-CM | POA: Diagnosis not present

## 2023-11-15 DIAGNOSIS — I10 Essential (primary) hypertension: Secondary | ICD-10-CM | POA: Diagnosis not present

## 2023-11-15 DIAGNOSIS — K922 Gastrointestinal hemorrhage, unspecified: Secondary | ICD-10-CM | POA: Diagnosis not present

## 2023-11-15 DIAGNOSIS — K2970 Gastritis, unspecified, without bleeding: Secondary | ICD-10-CM

## 2023-11-15 DIAGNOSIS — E119 Type 2 diabetes mellitus without complications: Secondary | ICD-10-CM | POA: Diagnosis not present

## 2023-11-15 HISTORY — PX: COLONOSCOPY WITH PROPOFOL: SHX5780

## 2023-11-15 HISTORY — PX: BIOPSY: SHX5522

## 2023-11-15 HISTORY — PX: ESOPHAGOGASTRODUODENOSCOPY (EGD) WITH PROPOFOL: SHX5813

## 2023-11-15 LAB — COMPREHENSIVE METABOLIC PANEL
ALT: 15 U/L (ref 0–44)
AST: 17 U/L (ref 15–41)
Albumin: 3.2 g/dL — ABNORMAL LOW (ref 3.5–5.0)
Alkaline Phosphatase: 60 U/L (ref 38–126)
Anion gap: 10 (ref 5–15)
BUN: 36 mg/dL — ABNORMAL HIGH (ref 8–23)
CO2: 20 mmol/L — ABNORMAL LOW (ref 22–32)
Calcium: 9 mg/dL (ref 8.9–10.3)
Chloride: 105 mmol/L (ref 98–111)
Creatinine, Ser: 2.44 mg/dL — ABNORMAL HIGH (ref 0.61–1.24)
GFR, Estimated: 28 mL/min — ABNORMAL LOW (ref >60)
Glucose, Bld: 103 mg/dL — ABNORMAL HIGH (ref 70–99)
Potassium: 4.5 mmol/L (ref 3.5–5.1)
Sodium: 135 mmol/L (ref 135–145)
Total Bilirubin: 0.4 mg/dL (ref 0.0–1.2)
Total Protein: 6.6 g/dL (ref 6.5–8.1)

## 2023-11-15 LAB — CBC
HCT: 19.1 % — ABNORMAL LOW (ref 39.0–52.0)
Hemoglobin: 6.2 g/dL — CL (ref 13.0–17.0)
MCH: 30 pg (ref 26.0–34.0)
MCHC: 32.5 g/dL (ref 30.0–36.0)
MCV: 92.3 fL (ref 80.0–100.0)
Platelets: 189 10*3/uL (ref 150–400)
RBC: 2.07 MIL/uL — ABNORMAL LOW (ref 4.22–5.81)
RDW: 15.2 % (ref 11.5–15.5)
WBC: 8.4 10*3/uL (ref 4.0–10.5)
nRBC: 0 % (ref 0.0–0.2)

## 2023-11-15 LAB — I-STAT CHEM 8, ED
BUN: 34 mg/dL — ABNORMAL HIGH (ref 8–23)
Calcium, Ion: 1.23 mmol/L (ref 1.15–1.40)
Chloride: 106 mmol/L (ref 98–111)
Creatinine, Ser: 2.5 mg/dL — ABNORMAL HIGH (ref 0.61–1.24)
Glucose, Bld: 117 mg/dL — ABNORMAL HIGH (ref 70–99)
HCT: 23 % — ABNORMAL LOW (ref 39.0–52.0)
Hemoglobin: 7.8 g/dL — ABNORMAL LOW (ref 13.0–17.0)
Potassium: 4.8 mmol/L (ref 3.5–5.1)
Sodium: 138 mmol/L (ref 135–145)
TCO2: 22 mmol/L (ref 22–32)

## 2023-11-15 LAB — GLUCOSE, CAPILLARY
Glucose-Capillary: 90 mg/dL (ref 70–99)
Glucose-Capillary: 74 mg/dL (ref 70–99)
Glucose-Capillary: 73 mg/dL (ref 70–99)

## 2023-11-15 LAB — PREPARE RBC (CROSSMATCH)

## 2023-11-15 LAB — CBG MONITORING, ED
Glucose-Capillary: 93 mg/dL (ref 70–99)
Glucose-Capillary: 102 mg/dL — ABNORMAL HIGH (ref 70–99)

## 2023-11-15 SURGERY — ESOPHAGOGASTRODUODENOSCOPY (EGD) WITH PROPOFOL
Anesthesia: Monitor Anesthesia Care

## 2023-11-15 MED ORDER — PHENYLEPHRINE 80 MCG/ML (10ML) SYRINGE FOR IV PUSH (FOR BLOOD PRESSURE SUPPORT)
PREFILLED_SYRINGE | INTRAVENOUS | Status: DC | PRN
  Administered 2023-11-15 (×2): 160 ug via INTRAVENOUS
  Administered 2023-11-15 (×2): 80 ug via INTRAVENOUS

## 2023-11-15 MED ORDER — SODIUM CHLORIDE 0.9 % IV SOLN: INTRAVENOUS | Status: DC | PRN

## 2023-11-15 MED ORDER — PANTOPRAZOLE SODIUM 40 MG PO TBEC
40.0000 mg | DELAYED_RELEASE_TABLET | Freq: Every day | ORAL | Status: DC
Start: 2023-11-15 — End: 2023-11-17
  Administered 2023-11-15 – 2023-11-16 (×2): 40 mg via ORAL
  Filled 2023-11-15 (×2): qty 1

## 2023-11-15 MED ORDER — LIDOCAINE 2% (20 MG/ML) 5 ML SYRINGE
INTRAMUSCULAR | Status: DC | PRN
  Administered 2023-11-15: 100 mg via INTRAVENOUS

## 2023-11-15 MED ORDER — SODIUM CHLORIDE 0.9% IV SOLUTION: Freq: Once | INTRAVENOUS | Status: AC

## 2023-11-15 MED ORDER — PROPOFOL 10 MG/ML IV BOLUS
INTRAVENOUS | Status: DC | PRN
  Administered 2023-11-15: 70 mg via INTRAVENOUS
  Administered 2023-11-15: 50 mg via INTRAVENOUS
  Administered 2023-11-15: 30 mg via INTRAVENOUS

## 2023-11-15 MED ORDER — PROPOFOL 500 MG/50ML IV EMUL
INTRAVENOUS | Status: DC | PRN
  Administered 2023-11-15: 175 ug/kg/min via INTRAVENOUS

## 2023-11-15 SURGICAL SUPPLY — 24 items
BLOCK BITE 60FR ADLT L/F BLUE (MISCELLANEOUS) ×2 IMPLANT
ELECT REM PT RETURN 9FT ADLT (ELECTROSURGICAL)
ELECTRODE REM PT RTRN 9FT ADLT (ELECTROSURGICAL) IMPLANT
FCP BXJMBJMB 240X2.8X (CUTTING FORCEPS)
FLOOR PAD 36X40 (MISCELLANEOUS) ×2
FORCEP RJ3 GP 1.8X160 W-NEEDLE (CUTTING FORCEPS) IMPLANT
FORCEPS BIOP RAD 4 LRG CAP 4 (CUTTING FORCEPS) IMPLANT
FORCEPS BIOP RJ4 240 W/NDL (CUTTING FORCEPS)
FORCEPS BXJMBJMB 240X2.8X (CUTTING FORCEPS) IMPLANT
INJECTOR/SNARE I SNARE (MISCELLANEOUS) IMPLANT
LUBRICANT JELLY 4.5OZ STERILE (MISCELLANEOUS) IMPLANT
MANIFOLD NEPTUNE II (INSTRUMENTS) IMPLANT
NDL SCLEROTHERAPY 25GX240 (NEEDLE) IMPLANT
NEEDLE SCLEROTHERAPY 25GX240 (NEEDLE) IMPLANT
PAD FLOOR 36X40 (MISCELLANEOUS) ×2 IMPLANT
PROBE APC STR FIRE (PROBE) IMPLANT
PROBE INJECTION GOLD 7FR (MISCELLANEOUS) IMPLANT
SNARE ROTATE MED OVAL 20MM (MISCELLANEOUS) IMPLANT
SNARE SHORT THROW 13M SML OVAL (MISCELLANEOUS) IMPLANT
SYR 50ML LL SCALE MARK (SYRINGE) IMPLANT
TRAP SPECIMEN MUCOUS 40CC (MISCELLANEOUS) IMPLANT
TUBING ENDO SMARTCAP PENTAX (MISCELLANEOUS) ×4 IMPLANT
TUBING IRRIGATION ENDOGATOR (MISCELLANEOUS) ×2 IMPLANT
WATER STERILE IRR 1000ML POUR (IV SOLUTION) IMPLANT

## 2023-11-15 NOTE — Transfer of Care (Signed)
Immediate Anesthesia Transfer of Care Note  Patient: Jorge Decker  Procedure(s) Performed: ESOPHAGOGASTRODUODENOSCOPY (EGD) WITH PROPOFOL COLONOSCOPY WITH PROPOFOL BIOPSY  Patient Location: Endoscopy Unit  Anesthesia Type:MAC  Level of Consciousness: drowsy and patient cooperative  Airway & Oxygen Therapy: Patient Spontanous Breathing and Patient connected to nasal cannula oxygen  Post-op Assessment: Report given to RN and Post -op Vital signs reviewed and stable  Post vital signs: Reviewed and stable  Last Vitals:  Vitals Value Taken Time  BP 105/60 11/15/23 1240  Temp    Pulse 83 11/15/23 1241  Resp 20 11/15/23 1241  SpO2 99 % 11/15/23 1241  Vitals shown include unfiled device data.  Last Pain:  Vitals:   11/15/23 1238  TempSrc:   PainSc: Asleep         Complications: No notable events documented.

## 2023-11-15 NOTE — Progress Notes (Signed)
Patient admitted to RM 5W 20 from ED. Patient came from Endo after having Colonoscopy done. Oriented to room. VSS. Resting in bed. Denies all complaints.

## 2023-11-15 NOTE — Plan of Care (Signed)

## 2023-11-15 NOTE — Anesthesia Procedure Notes (Signed)
Procedure Name: MAC Date/Time: 11/15/2023 12:07 PM  Performed by: Marcene Duos, MDPre-anesthesia Checklist: Patient identified, Emergency Drugs available, Suction available, Patient being monitored and Timeout performed Patient Re-evaluated:Patient Re-evaluated prior to induction Comments: HFNC

## 2023-11-15 NOTE — Interval H&P Note (Signed)
History and Physical Interval Note:  11/15/2023 11:51 AM  Jorge Decker  has presented today for surgery, with the diagnosis of GI bleed, Anemia.  The various methods of treatment have been discussed with the patient and family. After consideration of risks, benefits and other options for treatment, the patient has consented to  Procedure(s): ESOPHAGOGASTRODUODENOSCOPY (EGD) WITH PROPOFOL (N/A) COLONOSCOPY WITH PROPOFOL (N/A) as a surgical intervention.  The patient's history has been reviewed, patient examined, no change in status, stable for surgery.  I have reviewed the patient's chart and labs.  Questions were answered to the patient's satisfaction.     Autry Droege

## 2023-11-15 NOTE — Op Note (Signed)
[facility] Patient Name: Jorge Decker Procedure Date : 11/15/2023 MRN: [id number] Attending MD: Kathi Der , MD, [id number] Date of Birth: Jul 05, 1951 CSN: [id number] Age: 73 Admit Type: Inpatient Procedure:                Upper GI endoscopy Indications:              Recent gastrointestinal bleeding Providers:                Kathi Der, MD, Fransisca Connors, Beryle Beams, Technician, Dallie Dad CRNA Referring MD:              Medicines:                Sedation Administered by an Anesthesia Professional Complications:            No immediate complications. Estimated Blood Loss:     Estimated blood loss was minimal. Procedure:                Pre-Anesthesia Assessment:                           - Prior to the procedure, a History and Physical                            was performed, and patient medications and                            allergies were reviewed. The patient's tolerance of                            previous anesthesia was also reviewed. The risks                            and benefits of the procedure and the sedation                            options and risks were discussed with the patient.                            All questions were answered, and informed consent                            was obtained. Prior Anticoagulants: The patient has                            taken no anticoagulant or antiplatelet agents. ASA                            Grade Assessment: IV - A patient with severe                            systemic disease that is a constant threat to life.  After reviewing the risks and benefits, the patient                            was deemed in satisfactory condition to undergo the                            procedure.                           After obtaining informed consent, the endoscope was                            passed under direct vision. Throughout the                             procedure, the patient's blood pressure, pulse, and                            oxygen saturations were monitored continuously. The                            [id number]-[id number] ([id number]) Olympus endoscope was introduced                            through the mouth, and advanced to the second part                            of duodenum. The upper GI endoscopy was                            accomplished without difficulty. The patient                            tolerated the procedure well. Scope In: Scope Out: Findings:      The Z-line was regular and was found 37 cm from the incisors.      Diffuse moderate inflammation characterized by congestion (edema),       erythema and friability was found in the gastric body and in the gastric       antrum. Biopsies were taken with a cold forceps for histology.      The cardia and gastric fundus were normal on retroflexion.      A single 6 mm submucosal nodule with a localized distribution was found       in the second portion of the duodenum. Biopsies were taken with a cold       forceps for histology. Impression:               - Z-line regular, 37 cm from the incisors.                           - Chronic gastritis. Biopsied.                           - Submucosal nodule found in the duodenum. Biopsied. Recommendation:           - Perform a  colonoscopy today. Procedure Code(s):        --- Professional ---                           [phone], Esophagogastroduodenoscopy, flexible,                            transoral; with biopsy, single or multiple Diagnosis Code(s):        --- Professional ---                           K29.50, Unspecified chronic gastritis without                            bleeding                           K31.89, Other diseases of stomach and duodenum                           K92.2, Gastrointestinal hemorrhage, unspecified CPT copyright 2022 American [employer] Association. All rights reserved. The codes documented in  this report are preliminary and upon coder review may  be revised to meet current compliance requirements. Kathi Der, MD Kathi Der, MD 11/15/2023 12:39:11 PM Number of Addenda: 0

## 2023-11-15 NOTE — Op Note (Signed)
[facility] Patient Name: Jorge Decker Procedure Date : 11/15/2023 MRN: [id number] Attending MD: Kathi Der , MD, [id number] Date of Birth: 1951/03/07 CSN: [id number] Age: 73 Admit Type: Inpatient Procedure:                Colonoscopy Indications:              Gastrointestinal bleeding Providers:                Kathi Der, MD, Fransisca Connors, Beryle Beams, Technician Referring MD:              Medicines:                Sedation Administered by an Anesthesia Professional Complications:            No immediate complications. Estimated Blood Loss:     Estimated blood loss was minimal. Procedure:                Pre-Anesthesia Assessment:                           - Prior to the procedure, a History and Physical                            was performed, and patient medications and                            allergies were reviewed. The patient's tolerance of                            previous anesthesia was also reviewed. The risks                            and benefits of the procedure and the sedation                            options and risks were discussed with the patient.                            All questions were answered, and informed consent                            was obtained. Prior Anticoagulants: The patient has                            taken no anticoagulant or antiplatelet agents. ASA                            Grade Assessment: IV - A patient with severe                            systemic disease that is a constant threat to life.  After reviewing the risks and benefits, the patient                            was deemed in satisfactory condition to undergo the                            procedure.                           After obtaining informed consent, the colonoscope                            was passed under direct vision. Throughout the                             procedure, the patient's blood pressure, pulse, and                            oxygen saturations were monitored continuously. The                            [id number]-HQ190L ([id number]) Olympus colonoscope was                            introduced through the anus and advanced to the the                            terminal ileum, with identification of the                            appendiceal orifice and IC valve. The colonoscopy                            was performed without difficulty. The patient                            tolerated the procedure well. The quality of the                            bowel preparation was fair. The terminal ileum,                            ileocecal valve, appendiceal orifice, and rectum                            were photographed. Scope In: 12:23:14 PM Scope Out: 12:34:00 PM Scope Withdrawal Time: 0 hours 6 minutes 40 seconds  Total Procedure Duration: 0 hours 10 minutes 46 seconds  Findings:      Skin tags were found on perianal exam.      The terminal ileum appeared normal.      Multiple large-mouthed diverticula were found in the entire colon.      Internal hemorrhoids were found during retroflexion. The hemorrhoids       were small. Impression:               -  Preparation of the colon was fair.                           - Perianal skin tags found on perianal exam.                           - The examined portion of the ileum was normal.                           - Diverticulosis in the entire examined colon.                           - Internal hemorrhoids.                           - No specimens collected. Recommendation:           - Return patient to hospital ward for ongoing care.                           - Full liquid diet.                           - Continue present medications.                           - Repeat colonoscopy in 2 years for screening                            purposes.                           - Return to my office in 3  months. Procedure Code(s):        --- Professional ---                           [phone], Colonoscopy, flexible; diagnostic, including                            collection of specimen(s) by brushing or washing,                            when performed (separate procedure) Diagnosis Code(s):        --- Professional ---                           K64.8, Other hemorrhoids                           K64.4, Residual hemorrhoidal skin tags                           K92.2, Gastrointestinal hemorrhage, unspecified                           K57.30, Diverticulosis of large intestine without  perforation or abscess without bleeding CPT copyright 2022 American Medical Association. All rights reserved. The codes documented in this report are preliminary and upon coder review may  be revised to meet current compliance requirements. Kathi Der, MD Kathi Der, MD 11/15/2023 12:43:09 PM Number of Addenda: 0

## 2023-11-15 NOTE — Brief Op Note (Signed)
11/15/2023  12:43 PM  PATIENT:  Jorge Decker  73 y.o. male  PRE-OPERATIVE DIAGNOSIS:  GI bleed, Anemia  POST-OPERATIVE DIAGNOSIS:  EGD: Gastritis; Duodenal nodule Colon: Diverticulosis;Hemorrhoids  PROCEDURE:  Procedure(s): ESOPHAGOGASTRODUODENOSCOPY (EGD) WITH PROPOFOL (N/A) COLONOSCOPY WITH PROPOFOL (N/A) BIOPSY  SURGEON:  Surgeons and Role:    * Khylah Kendra, MD - Primary  Findings ------------ -EGD showed gastritis but no evidence of active bleeding.  Small duodenal nodule.  Likely benign.  Biopsy taken. -Colonoscopy showed fair prep with brown-colored liquid stool in the colon, normal TI, severe diverticulosis and small internal hemorrhoids.  No evidence of active bleeding.  Recommendations ------------------------- -Start full liquid diet. -Protonix 40 mg daily -Monitor H&H. -If ongoing drop in hemoglobin, he may need capsule endoscopy. -GI will follow  Kathi Der MD, FACP 11/15/2023, 12:45 PM  Contact #  [phone]

## 2023-11-15 NOTE — Anesthesia Preprocedure Evaluation (Signed)
Anesthesia Evaluation  Patient identified by MRN, date of birth, ID band Patient awake    Reviewed: Allergy & Precautions, NPO status , Patient's Chart, lab work & pertinent test results  Airway Mallampati: II  TM Distance: >3 FB Neck ROM: Full    Dental  (+) Dental Advisory Given   Pulmonary neg pulmonary ROS   breath sounds clear to auscultation       Cardiovascular hypertension, Pt. on medications  Rhythm:Regular Rate:Normal     Neuro/Psych negative neurological ROS     GI/Hepatic Neg liver ROS,,,GI bleed    Endo/Other  diabetes, Type 2    Renal/GU Renal disease     Musculoskeletal   Abdominal   Peds  Hematology  (+) Blood dyscrasia, anemia   Anesthesia Other Findings   Reproductive/Obstetrics                             Anesthesia Physical Anesthesia Plan  ASA: 4  Anesthesia Plan: MAC   Post-op Pain Management:    Induction:   PONV Risk Score and Plan: 1 and Propofol infusion  Airway Management Planned: Natural Airway and Nasal Cannula  Additional Equipment: None  Intra-op Plan:   Post-operative Plan:   Informed Consent: I have reviewed the patients History and Physical, chart, labs and discussed the procedure including the risks, benefits and alternatives for the proposed anesthesia with the patient or authorized representative who has indicated his/her understanding and acceptance.       Plan Discussed with:   Anesthesia Plan Comments:        Anesthesia Quick Evaluation

## 2023-11-15 NOTE — Progress Notes (Signed)
PROGRESS NOTE  Jorge Decker  ZOX:[id number] DOB: September 06, 1951 DOA: 11/14/2023 PCP: [facility]   Brief Narrative: Patient is a 73 year old male with history of CKD stage IIIb, type 2 diabetes, hypertension, prostate cancer status post radiotherapy who presented with rectal bleeding from home.  On presentation, hemoglobin was in the range of 6, creatinine was 2.9.  He was hypotensive.  Patient was given a unit of PRBC, started on IV fluids.  GI consulted.  Underwent EGD and colonoscopy . EGD with finding of gastritis, no evidence of active bleeding, small duodenal nodule.  Colonoscopy showed severe diverticulosis, small internal hemorrhoids, no evidence of active bleed.  Started on for liquid diet.Currently on PPI  Assessment & Plan:  Principal Problem:   Acute GI bleeding  Acute blood loss anemia: Presented with hemoglobin in the range of 6, given unit of blood transfusion.  Currently hemoglobin is of 7. Continue to monitor H&H. GI consulted.  Underwent EGD and colonoscopy . EGD with finding of gastritis, no evidence of active bleeding, small duodenal nodule.  Colonoscopy showed severe diverticulosis, small internal hemorrhoids, no evidence of active bleed.  Started on full liquid diet.Currently on PPI.  If continues to drop hemoglobin, GI planning for capsule endoscopy.  Likely diverticular bleed.  AKI on CKD stage IIIb: Baseline creatinine around 2.  Presented with creatinine of 2.9,slightly  improved.  Avoid  nephrotoxins.  Monitor BMP.  History of hypertension: Presented with low blood pressure.  Takes Norvasc, Coreg, telmisartan, currently on hold.    Type 2 diabetes: Recent A1c of 5.7.  No longer takes any medications for this.  Hyperlipidemia: On statin  History of prostate cancer: Status post radiation therapy.  In remission.  Follows with urology        DVT prophylaxis:SCDs Start: 11/14/23 8119     Code Status: Full Code  Family Communication: None  at bedside  Patient status:Inpatient  Patient is from :home  Anticipated discharge JY:NWGN  Estimated DC date:1-2 days   Consultants: GI  Procedures:EGD/colo  Antimicrobials:  Anti-infectives (From admission, onward)    None       Subjective: Patient seen and examined at bedside today.  Hemodynamically stable.  Comfortable.  Just came from EGD/colonoscopy.  No abdomen pain, nausea vomiting.  No bloody bowel movement after he came here.  Objective: Vitals:   11/15/23 1238 11/15/23 1240 11/15/23 1250 11/15/23 1319  BP: (!) 82/60 105/60 112/81 (!) 141/85  Pulse: 72 75 85 75  Resp: 14 16 (!) 21 18  Temp: (!) 97.5 F (36.4 C)     TempSrc: Temporal     SpO2: 100% 100% 99% 97%  Weight:      Height:        Intake/Output Summary (Last 24 hours) at 11/15/2023 1350 Last data filed at 11/15/2023 1242 Gross per 24 hour  Intake 1400.23 ml  Output 2 ml  Net 1398.23 ml   Filed Weights   11/14/23 0457  Weight: 70.8 kg    Examination:  General exam: Overall comfortable, not in distress HEENT: PERRL Respiratory system:  no wheezes or crackles  Cardiovascular system: S1 & S2 heard, RRR.  Gastrointestinal system: Abdomen is nondistended, soft and nontender. Central nervous system: Alert and oriented Extremities: No edema, no clubbing ,no cyanosis Skin: No rashes, no ulcers,no icterus     Data Reviewed: I have personally reviewed following labs and imaging studies  CBC: Recent Labs  Lab 11/14/23 0509 11/14/23 2223 11/15/23 0401 11/15/23 1142  WBC 15.2*  --  8.4  --   HGB 6.0* 7.0* 6.2* 7.8*  HCT 18.6* 21.0* 19.1* 23.0*  MCV 97.9  --  92.3  --   PLT 244  --  189  --    Basic Metabolic Panel: Recent Labs  Lab 11/14/23 0509 11/15/23 0401 11/15/23 1142  NA 133* 135 138  K 3.9 4.5 4.8  CL 103 105 106  CO2 17* 20*  --   GLUCOSE 177* 103* 117*  BUN 40* 36* 34*  CREATININE 2.91* 2.44* 2.50*  CALCIUM 9.1 9.0  --      No results found for this or any  previous visit (from the past 240 hours).   Radiology Studies: No results found.  Scheduled Meds:  pantoprazole  40 mg Oral Daily   rosuvastatin  5 mg Oral Daily   Continuous Infusions:   LOS: 1 day   Burnadette Pop, MD [facility] P1/21/2025, 1:50 PM

## 2023-11-15 NOTE — ED Notes (Signed)
Provider paged x2 regarding patient low hemoglobin

## 2023-11-16 DIAGNOSIS — I10 Essential (primary) hypertension: Secondary | ICD-10-CM

## 2023-11-16 DIAGNOSIS — K922 Gastrointestinal hemorrhage, unspecified: Secondary | ICD-10-CM | POA: Diagnosis not present

## 2023-11-16 DIAGNOSIS — D649 Anemia, unspecified: Secondary | ICD-10-CM | POA: Diagnosis not present

## 2023-11-16 DIAGNOSIS — E785 Hyperlipidemia, unspecified: Secondary | ICD-10-CM | POA: Diagnosis not present

## 2023-11-16 LAB — BASIC METABOLIC PANEL
Anion gap: 6 (ref 5–15)
BUN: 28 mg/dL — ABNORMAL HIGH (ref 8–23)
CO2: 24 mmol/L (ref 22–32)
Calcium: 8.9 mg/dL (ref 8.9–10.3)
Chloride: 107 mmol/L (ref 98–111)
Creatinine, Ser: 2.37 mg/dL — ABNORMAL HIGH (ref 0.61–1.24)
GFR, Estimated: 29 mL/min — ABNORMAL LOW (ref >60)
Glucose, Bld: 165 mg/dL — ABNORMAL HIGH (ref 70–99)
Potassium: 4.5 mmol/L (ref 3.5–5.1)
Sodium: 137 mmol/L (ref 135–145)

## 2023-11-16 LAB — CBC
HCT: 20.7 % — ABNORMAL LOW (ref 39.0–52.0)
Hemoglobin: 7.1 g/dL — ABNORMAL LOW (ref 13.0–17.0)
MCH: 31 pg (ref 26.0–34.0)
MCHC: 34.3 g/dL (ref 30.0–36.0)
MCV: 90.4 fL (ref 80.0–100.0)
Platelets: 198 10*3/uL (ref 150–400)
RBC: 2.29 MIL/uL — ABNORMAL LOW (ref 4.22–5.81)
RDW: 15 % (ref 11.5–15.5)
WBC: 8.2 10*3/uL (ref 4.0–10.5)
nRBC: 0 % (ref 0.0–0.2)

## 2023-11-16 LAB — GLUCOSE, CAPILLARY
Glucose-Capillary: 101 mg/dL — ABNORMAL HIGH (ref 70–99)
Glucose-Capillary: 168 mg/dL — ABNORMAL HIGH (ref 70–99)
Glucose-Capillary: 88 mg/dL (ref 70–99)
Glucose-Capillary: 107 mg/dL — ABNORMAL HIGH (ref 70–99)

## 2023-11-16 LAB — SURGICAL PATHOLOGY

## 2023-11-16 LAB — PREPARE RBC (CROSSMATCH)

## 2023-11-16 MED ORDER — SODIUM CHLORIDE 0.9% IV SOLUTION: Freq: Once | INTRAVENOUS | Status: DC

## 2023-11-16 MED ORDER — ACETAMINOPHEN 325 MG PO TABS
650.0000 mg | ORAL_TABLET | Freq: Once | ORAL | Status: AC
  Administered 2023-11-16: 650 mg via ORAL
  Filled 2023-11-16: qty 2

## 2023-11-16 MED ORDER — DIPHENHYDRAMINE HCL 25 MG PO CAPS
25.0000 mg | ORAL_CAPSULE | Freq: Once | ORAL | Status: AC
  Administered 2023-11-16: 25 mg via ORAL
  Filled 2023-11-16: qty 1

## 2023-11-16 NOTE — Anesthesia Postprocedure Evaluation (Signed)
Anesthesia Post Note  Patient: Jorge Decker  Procedure(s) Performed: ESOPHAGOGASTRODUODENOSCOPY (EGD) WITH PROPOFOL COLONOSCOPY WITH PROPOFOL BIOPSY     Patient location during evaluation: PACU Anesthesia Type: MAC Level of consciousness: awake and alert Pain management: pain level controlled Vital Signs Assessment: post-procedure vital signs reviewed and stable Respiratory status: spontaneous breathing, nonlabored ventilation, respiratory function stable and patient connected to nasal cannula oxygen Cardiovascular status: stable and blood pressure returned to baseline Postop Assessment: no apparent nausea or vomiting Anesthetic complications: no   No notable events documented.  Last Vitals:  Vitals:   11/16/23 1520 11/16/23 1600  BP: 115/84 119/84  Pulse: 83 83  Resp: (!) 21 19  Temp:  36.7 C  SpO2: 96% 97%    Last Pain:  Vitals:   11/16/23 1600  TempSrc: Oral  PainSc:                  Kennieth Rad

## 2023-11-16 NOTE — Progress Notes (Signed)
PROGRESS NOTE        PATIENT DETAILS Name: Jorge Decker Age: 73 y.o. Sex: male Date of Birth: 06-Jun-1951 Admit Date: 11/14/2023 Admitting Physician Briscoe Burns, MD PCP:[facility]  Brief Summary: Patient is a 73 y.o.  male with history of HTN, DM-2, CKD stage IIIb, prostate cancer-s/p radiotherapy-who presented with lower GI bleeding and acute blood loss anemia.  Significant events: 1/20>> admit to [facility]  Significant studies: None  Significant microbiology data: None  Procedures: 1/21>> EGD: Chronic gastritis, submucosal nodule in the duodenum 1/21>> colonoscopy: Diverticula in the entire colon  Consults: None  Subjective: No hematochezia overnight-tolerating liquids-wants to go home.  I ambulated this patient in the hallway-although he was not excessively short of breath-he was tachycardic to the 120s (Hb 7.1 this morning)  Objective: Vitals: Blood pressure (!) 139/91, pulse 78, temperature (!) 97.4 F (36.3 C), resp. rate 12, height 5\' 6"  (1.676 m), weight 70.8 kg, SpO2 100%.   Exam: Gen Exam:Alert awake-not in any distress HEENT:atraumatic, normocephalic Chest: B/L clear to auscultation anteriorly CVS:S1S2 regular Abdomen:soft non tender, non distended Extremities:no edema Neurology: Non focal Skin: no rash  Pertinent Labs/Radiology:    Latest Ref Rng & Units 11/16/2023    4:47 AM 11/15/2023   11:42 AM 11/15/2023    4:01 AM  CBC  WBC 4.0 - 10.5 K/uL 8.2   8.4   Hemoglobin 13.0 - 17.0 g/dL 7.1  7.8  6.2   Hematocrit 39.0 - 52.0 % 20.7  23.0  19.1   Platelets 150 - 400 K/uL 198   189     Lab Results  Component Value Date   NA 137 11/16/2023   K 4.5 11/16/2023   CL 107 11/16/2023   CO2 24 11/16/2023      Assessment/Plan: Lower GI bleeding with acute blood loss anemia Suspected diverticular bleed No further hematochezia overnight but Hb 7.1-symptomatic-mildly short of breath-tachycardic when  ambulated in the hallway Transfuse 1 unit of PRBC Suspect home later today or tomorrow morning if he continues to have no further hematochezia.  AKI on CKD stage IIIb AKI hemodynamically mediated in the setting of GI bleeding Creatinine has improved and almost back to baseline  HTN BP now creeping up Resume amlodipine Continue to hold Coreg/Micardis Follow/optimize  DM-2 Recent A1c 5.7 Managed with diet/lifestyle changes  HLD Statin  History of prostate cancer Outpatient follow-up with urology  BMI: Estimated body mass index is 25.18 kg/m as calculated from the following:   Height as of this encounter: 5\' 6"  (1.676 m).   Weight as of this encounter: 70.8 kg.   Code status:   Code Status: Full Code   DVT Prophylaxis: SCDs Start: 11/14/23 0723   Family Communication: None at bedside   Disposition Plan: Status is: Inpatien Remains inpatient appropriate because: Severity of illness   Planned Discharge Destination:Home   Diet: Diet Order             Diet heart healthy/carb modified Room service appropriate? Yes; Fluid consistency: Thin  Diet effective now                     Antimicrobial agents: Anti-infectives (From admission, onward)    None        MEDICATIONS: Scheduled Meds:  sodium chloride   Intravenous Once   acetaminophen  650 mg  Oral Once   diphenhydrAMINE  25 mg Oral Once   pantoprazole  40 mg Oral Daily   rosuvastatin  5 mg Oral Daily   Continuous Infusions: PRN Meds:.acetaminophen **OR** acetaminophen   I have personally reviewed following labs and imaging studies  LABORATORY DATA: CBC: Recent Labs  Lab 11/14/23 0509 11/14/23 2223 11/15/23 0401 11/15/23 1142 11/16/23 0447  WBC 15.2*  --  8.4  --  8.2  HGB 6.0* 7.0* 6.2* 7.8* 7.1*  HCT 18.6* 21.0* 19.1* 23.0* 20.7*  MCV 97.9  --  92.3  --  90.4  PLT 244  --  189  --  198    Basic Metabolic Panel: Recent Labs  Lab 11/14/23 0509 11/15/23 0401 11/15/23 1142  11/16/23 0447  NA 133* 135 138 137  K 3.9 4.5 4.8 4.5  CL 103 105 106 107  CO2 17* 20*  --  24  GLUCOSE 177* 103* 117* 165*  BUN 40* 36* 34* 28*  CREATININE 2.91* 2.44* 2.50* 2.37*  CALCIUM 9.1 9.0  --  8.9    GFR: Estimated Creatinine Clearance: 26.2 mL/min (A) (by C-G formula based on SCr of 2.37 mg/dL (H)).  Liver Function Tests: Recent Labs  Lab 11/14/23 0509 11/15/23 0401  AST 20 17  ALT 17 15  ALKPHOS 59 60  BILITOT 0.6 0.4  PROT 7.0 6.6  ALBUMIN 3.3* 3.2*   No results for input(s): "LIPASE", "AMYLASE" in the last 168 hours. No results for input(s): "AMMONIA" in the last 168 hours.  Coagulation Profile: No results for input(s): "INR", "PROTIME" in the last 168 hours.  Cardiac Enzymes: No results for input(s): "CKTOTAL", "CKMB", "CKMBINDEX", "TROPONINI" in the last 168 hours.  BNP (last 3 results) No results for input(s): "PROBNP" in the last 8760 hours.  Lipid Profile: No results for input(s): "CHOL", "HDL", "LDLCALC", "TRIG", "CHOLHDL", "LDLDIRECT" in the last 72 hours.  Thyroid Function Tests: No results for input(s): "TSH", "T4TOTAL", "FREET4", "T3FREE", "THYROIDAB" in the last 72 hours.  Anemia Panel: No results for input(s): "VITAMINB12", "FOLATE", "FERRITIN", "TIBC", "IRON", "RETICCTPCT" in the last 72 hours.  Urine analysis:    Component Value Date/Time   COLORURINE YELLOW 06/08/2016 1707   APPEARANCEUR CLEAR 06/08/2016 1707   LABSPEC 1.036 (H) 06/08/2016 1707   PHURINE 5.5 06/08/2016 1707   GLUCOSEU >1000 (A) 06/08/2016 1707   HGBUR TRACE (A) 06/08/2016 1707   BILIRUBINUR NEGATIVE 06/08/2016 1707   KETONESUR 15 (A) 06/08/2016 1707   PROTEINUR NEGATIVE 06/08/2016 1707   NITRITE NEGATIVE 06/08/2016 1707   LEUKOCYTESUR NEGATIVE 06/08/2016 1707    Sepsis Labs: Lactic Acid, Venous No results found for: "LATICACIDVEN"  MICROBIOLOGY: No results found for this or any previous visit (from the past 240 hours).  RADIOLOGY STUDIES/RESULTS: No  results found.   LOS: 2 days   Jeoffrey Massed, MD  [facility]    To contact the attending provider between 7A-7P or the covering provider during after hours 7P-7A, please log into the web site [email or web address] and access using universal [place] password for that web site. If you do not have the password, please call the hospital operator.  11/16/2023, 11:40 AM

## 2023-11-16 NOTE — Progress Notes (Signed)
[facility] Progress Note  Jorge Decker 73 y.o. July 08, 1951  CC: GI bleed   Subjective: Patient seen and examined at bedside.  Denies any further bleeding.  Wants to go home.  ROS : Afebrile and negative for chest pain   Objective: Vital signs in last 24 hours: Vitals:   11/16/23 0305 11/16/23 0748  BP: 124/87 (!) 139/91  Pulse: 85 78  Resp: 14 12  Temp: 97.6 F (36.4 C) (!) 97.4 F (36.3 C)  SpO2: 98% 100%    Physical Exam: Resting comfortably, not in acute distress, abdominal exam benign. Lab Results: Recent Labs    11/15/23 0401 11/15/23 1142 11/16/23 0447  NA 135 138 137  K 4.5 4.8 4.5  CL 105 106 107  CO2 20*  --  24  GLUCOSE 103* 117* 165*  BUN 36* 34* 28*  CREATININE 2.44* 2.50* 2.37*  CALCIUM 9.0  --  8.9   Recent Labs    11/14/23 0509 11/15/23 0401  AST 20 17  ALT 17 15  ALKPHOS 59 60  BILITOT 0.6 0.4  PROT 7.0 6.6  ALBUMIN 3.3* 3.2*   Recent Labs    11/15/23 0401 11/15/23 1142 11/16/23 0447  WBC 8.4  --  8.2  HGB 6.2* 7.8* 7.1*  HCT 19.1* 23.0* 20.7*  MCV 92.3  --  90.4  PLT 189  --  198   No results for input(s): "LABPROT", "INR" in the last 72 hours.    Assessment/Plan: -Rectal bleeding and dark stools.  Resolved now.  Status post EGD and colonoscopy yesterday.  Please see findings below.  EGD showed gastritis but no evidence of active bleeding.  Small duodenal nodule.  Likely benign.  Biopsy taken. -Colonoscopy showed fair prep with brown-colored liquid stool in the colon, normal TI, severe diverticulosis and small internal hemorrhoids.  No evidence of active bleeding.   Recommendations ------------------------- -No further bleeding episodes.  Hemoglobin 7.1 this morning.  Was getting another unit of blood transfusion.  Patient wants to go home.  I think it is reasonable to observe him overnight and discharge tomorrow if no further bleeding and hemoglobin stable. -Discussed with hospitalist. -Follow-up in GI clinic  in 2 to 3 months after discharge.  If ongoing anemia, he may need capsule endoscopy as an outpatient.  Kathi Der MD, FACP 11/16/2023, 12:05 PM  Contact #  [phone]

## 2023-11-16 NOTE — Plan of Care (Signed)

## 2023-11-17 ENCOUNTER — Other Ambulatory Visit (HOSPITAL_COMMUNITY): Payer: Self-pay

## 2023-11-17 DIAGNOSIS — E1100 Type 2 diabetes mellitus with hyperosmolarity without nonketotic hyperglycemic-hyperosmolar coma (NKHHC): Secondary | ICD-10-CM | POA: Diagnosis not present

## 2023-11-17 DIAGNOSIS — D649 Anemia, unspecified: Secondary | ICD-10-CM | POA: Diagnosis not present

## 2023-11-17 DIAGNOSIS — K922 Gastrointestinal hemorrhage, unspecified: Secondary | ICD-10-CM | POA: Diagnosis not present

## 2023-11-17 LAB — TYPE AND SCREEN
ABO/RH(D): B POS
Antibody Screen: NEGATIVE
Unit division: 0
Unit division: 0
Unit division: 0

## 2023-11-17 LAB — BPAM RBC
Blood Product Expiration Date: 202502032359
Blood Product Expiration Date: 202502032359
Blood Product Expiration Date: 202502042359
ISSUE DATE / TIME: 202501200647
ISSUE DATE / TIME: 202501210544
ISSUE DATE / TIME: 202501221158
Unit Type and Rh: 7300
Unit Type and Rh: 7300
Unit Type and Rh: 7300

## 2023-11-17 LAB — CBC
HCT: 23.8 % — ABNORMAL LOW (ref 39.0–52.0)
Hemoglobin: 8 g/dL — ABNORMAL LOW (ref 13.0–17.0)
MCH: 30.4 pg (ref 26.0–34.0)
MCHC: 33.6 g/dL (ref 30.0–36.0)
MCV: 90.5 fL (ref 80.0–100.0)
Platelets: 216 10*3/uL (ref 150–400)
RBC: 2.63 MIL/uL — ABNORMAL LOW (ref 4.22–5.81)
RDW: 14.8 % (ref 11.5–15.5)
WBC: 7.9 10*3/uL (ref 4.0–10.5)
nRBC: 0 % (ref 0.0–0.2)

## 2023-11-17 LAB — GLUCOSE, CAPILLARY: Glucose-Capillary: 106 mg/dL — ABNORMAL HIGH (ref 70–99)

## 2023-11-17 MED ORDER — AMOXICILLIN 500 MG PO CAPS
1000.0000 mg | ORAL_CAPSULE | Freq: Two times a day (BID) | ORAL | Status: DC
  Administered 2023-11-17: 1000 mg via ORAL
  Filled 2023-11-17: qty 2

## 2023-11-17 MED ORDER — PANTOPRAZOLE SODIUM 40 MG PO TBEC
40.0000 mg | DELAYED_RELEASE_TABLET | Freq: Two times a day (BID) | ORAL | 0 refills | Status: AC
  Filled 2023-11-17: qty 28, 14d supply, fill #0

## 2023-11-17 MED ORDER — CLARITHROMYCIN 500 MG PO TABS
500.0000 mg | ORAL_TABLET | Freq: Two times a day (BID) | ORAL | Status: DC
  Administered 2023-11-17: 500 mg via ORAL
  Filled 2023-11-17: qty 1

## 2023-11-17 MED ORDER — CLARITHROMYCIN 250 MG PO TABS
250.0000 mg | ORAL_TABLET | Freq: Two times a day (BID) | ORAL | 0 refills | Status: AC
  Filled 2023-11-17: qty 28, 14d supply, fill #0

## 2023-11-17 MED ORDER — AMOXICILLIN 500 MG PO CAPS
1000.0000 mg | ORAL_CAPSULE | Freq: Two times a day (BID) | ORAL | 0 refills | Status: AC
  Filled 2023-11-17: qty 56, 14d supply, fill #0

## 2023-11-17 MED ORDER — PANTOPRAZOLE SODIUM 40 MG PO TBEC
40.0000 mg | DELAYED_RELEASE_TABLET | Freq: Two times a day (BID) | ORAL | Status: DC
  Administered 2023-11-17: 40 mg via ORAL
  Filled 2023-11-17: qty 1

## 2023-11-17 NOTE — Plan of Care (Signed)

## 2023-11-17 NOTE — Discharge Summary (Signed)
PATIENT DETAILS Name: Jorge Decker Age: 73 y.o. Sex: male Date of Birth: [birth year] MRN: [id number]. Admitting Physician: Briscoe Burns, MD PCP:[facility], [facility]  Admit Date: 11/14/2023 Discharge date: 11/17/2023  Recommendations for Outpatient Follow-up:  Follow up with PCP in 1-2 weeks Please obtain CMP/CBC in one week  Admitted From:  Home  Disposition: Home   Discharge Condition: good  CODE STATUS:   Code Status: Full Code   Diet recommendation:  Diet Order             Diet - low sodium heart healthy           Diet Carb Modified           Diet heart healthy/carb modified Room service appropriate? Yes; Fluid consistency: Thin  Diet effective now                    Brief Summary: Patient is a 73 y.o.  male with history of HTN, DM-2, CKD stage IIIb, prostate cancer-s/p radiotherapy-who presented with lower GI bleeding and acute blood loss anemia.   Significant events: 1/20>> admit to [facility]   Significant studies: None   Significant microbiology data: None   Procedures: 1/21>> EGD: Chronic gastritis, submucosal nodule in the duodenum 1/21>> colonoscopy: Diverticula in the entire colon   Consults: None  Brief Hospital Course: Lower GI bleeding with acute blood loss anemia Suspected diverticular bleed No further hematochezia x 48 hours Hb now stable at 8.0-required a total of 3 units of PRBC Stable for discharge today-follow-up with GI as an outpatient.    AKI on CKD stage IIIb AKI hemodynamically mediated in the setting of GI bleeding Creatinine has improved and almost back to baseline  H. pylori infection Seen on EGD biopsy-discussed with GI MD-Dr. Brahmbhatt-recommends amoxicillin/clarithromycin/PPI x 14 days.   HTN All antihypertensives initially held-now the GI bleeding is stopped-will resume Coreg/Micardis-continue to hold amlodipine as clarithromycin can cause amlodipine toxicity per pharmacy.  Suspect amlodipine  can be resumed by PCP once he completes his anti-H. pylori treatment.    DM-2 Recent A1c 5.7 Managed with diet/lifestyle changes   HLD Statin   History of prostate cancer Outpatient follow-up with urology   BMI: Estimated body mass index is 25.18 kg/m as calculated from the following:   Height as of this encounter: 5\' 6"  (1.676 m).   Weight as of this encounter: 70.8 kg.    Discharge Diagnoses:  Principal Problem:   Acute GI bleeding   Discharge Instructions:  Activity:  As tolerated   Discharge Instructions     Call MD for:   Complete by: As directed    Hematochezia   Diet - low sodium heart healthy   Complete by: As directed    Diet Carb Modified   Complete by: As directed    Discharge instructions   Complete by: As directed    Follow with Primary MD  [facility], [facility] in 1-2 weeks  Please get a complete blood count and chemistry panel checked by your Primary MD at your next visit, and again as instructed by your Primary MD.  Get Medicines reviewed and adjusted: Please take all your medications with you for your next visit with your Primary MD  Laboratory/radiological data: Please request your Primary MD to go over all hospital tests and procedure/radiological results at the follow up, please ask your Primary MD to get all Hospital records sent to his/her office.  In some cases, they will  be blood work, cultures and biopsy results pending at the time of your discharge. Please request that your primary care M.D. follows up on these results.  Also Note the following: If you experience worsening of your admission symptoms, develop shortness of breath, life threatening emergency, suicidal or homicidal thoughts you must seek medical attention immediately by calling 911 or calling your MD immediately  if symptoms less severe.  You must read complete instructions/literature along with all the possible adverse reactions/side effects for all the  Medicines you take and that have been prescribed to you. Take any new Medicines after you have completely understood and accpet all the possible adverse reactions/side effects.   Do not drive when taking Pain medications or sleeping medications (Benzodaizepines)  Do not take more than prescribed Pain, Sleep and Anxiety Medications. It is not advisable to combine anxiety,sleep and pain medications without talking with your primary care practitioner  Special Instructions: If you have smoked or chewed Tobacco  in the last 2 yrs please stop smoking, stop any regular Alcohol  and or any Recreational drug use.  Wear Seat belts while driving.  Please note: You were cared for by a hospitalist during your hospital stay. Once you are discharged, your primary care physician will handle any further medical issues. Please note that NO REFILLS for any discharge medications will be authorized once you are discharged, as it is imperative that you return to your primary care physician (or establish a relationship with a primary care physician if you do not have one) for your post hospital discharge needs so that they can reassess your need for medications and monitor your lab values.   Your endoscopy biopsies were positive for H. pylori-we have started you on 2 antibiotics and a proton pump inhibitor x 14 days.  While you are on this medication-please do not take amlodipine as it can cause amlodipine toxicity.  You can continue to take Coreg and Micardis.  Please restart amlodipine only after you talk with your primary care practitioner.   Increase activity slowly   Complete by: As directed    Increase activity slowly   Complete by: As directed       Allergies as of 11/17/2023   No Known Allergies      Medication List     STOP taking these medications    amLODipine 10 MG tablet Commonly known as: NORVASC   aspirin EC 81 MG tablet       TAKE these medications    allopurinol 300 MG  tablet Commonly known as: ZYLOPRIM Take 300 mg by mouth 3 (three) times a week.   amoxicillin 500 MG capsule Commonly known as: AMOXIL Take 2 capsules (1,000 mg total) by mouth every 12 (twelve) hours for 14 days.   blood glucose meter kit and supplies Kit Dispense based on patient and insurance preference. Use up to four times daily as directed. (FOR ICD-9 250.00, 250.01).   carvedilol 6.25 MG tablet Commonly known as: COREG Take 6.25 mg by mouth 2 (two) times daily.   clarithromycin 500 MG tablet Commonly known as: BIAXIN Take 1 tablet (500 mg total) by mouth every 12 (twelve) hours for 14 days.   pantoprazole 40 MG tablet Commonly known as: PROTONIX Take 1 tablet (40 mg total) by mouth 2 (two) times daily for 14 days.   rosuvastatin 5 MG tablet Commonly known as: CRESTOR Take 5 mg by mouth daily.   sodium bicarbonate 650 MG tablet Take 650 mg by mouth 2 (two)  times daily.   telmisartan 40 MG tablet Commonly known as: MICARDIS Take 40 mg by mouth daily.   vitamin B-12 500 MCG tablet Commonly known as: CYANOCOBALAMIN Take 500 mcg by mouth daily.   Vitamin D3 25 MCG (1000 UT) Caps Take 2,000 Units by mouth daily.        Follow-up Information     [facility], [facility]. Schedule an appointment as soon as possible for a visit in 2 month(s).   Why: Follow-up for anemia and GI bleed Contact information: [address] [place] [address] [phone]         [facility]. Schedule an appointment as soon as possible for a visit in 1 week(s).   Contact information: [address], [address] [place] [address] [phone]                No Known Allergies   Other Procedures/Studies: No results found.   TODAY-DAY OF DISCHARGE:  Subjective:   [employer] today has no headache,no chest abdominal pain,no new weakness tingling or numbness, feels much better wants to go home today.   Objective:    Blood pressure (!) 131/96, pulse 78, temperature 98.4 F (36.9 C), temperature source Oral, resp. rate 19, height 5\' 6"  (1.676 m), weight 70.8 kg, SpO2 100%.  Intake/Output Summary (Last 24 hours) at 11/17/2023 0949 Last data filed at 11/16/2023 1700 Gross per 24 hour  Intake 875 ml  Output --  Net 875 ml   Filed Weights   11/14/23 0457  Weight: 70.8 kg    Exam: Awake Alert, Oriented *3, No new F.N deficits, Normal affect [place].AT,PERRAL Supple Neck,No JVD, No cervical lymphadenopathy appriciated.  Symmetrical Chest wall movement, Good air movement bilaterally, CTAB RRR,No Gallops,Rubs or new Murmurs, No Parasternal Heave +ve B.Sounds, Abd Soft, Non tender, No organomegaly appriciated, No rebound -guarding or rigidity. No Cyanosis, Clubbing or edema, No new Rash or bruise   PERTINENT RADIOLOGIC STUDIES: No results found.   PERTINENT LAB RESULTS: CBC: Recent Labs    11/16/23 0447 11/17/23 0545  WBC 8.2 7.9  HGB 7.1* 8.0*  HCT 20.7* 23.8*  PLT 198 216   CMET CMP     Component Value Date/Time   NA 137 11/16/2023 0447   K 4.5 11/16/2023 0447   CL 107 11/16/2023 0447   CO2 24 11/16/2023 0447   GLUCOSE 165 (H) 11/16/2023 0447   BUN 28 (H) 11/16/2023 0447   CREATININE 2.37 (H) 11/16/2023 0447   CALCIUM 8.9 11/16/2023 0447   PROT 6.6 11/15/2023 0401   ALBUMIN 3.2 (L) 11/15/2023 0401   AST 17 11/15/2023 0401   ALT 15 11/15/2023 0401   ALKPHOS 60 11/15/2023 0401   BILITOT 0.4 11/15/2023 0401   GFRNONAA 29 (L) 11/16/2023 0447    GFR Estimated Creatinine Clearance: 26.2 mL/min (A) (by C-G formula based on SCr of 2.37 mg/dL (H)). No results for input(s): "LIPASE", "AMYLASE" in the last 72 hours. No results for input(s): "CKTOTAL", "CKMB", "CKMBINDEX", "TROPONINI" in the last 72 hours. Invalid input(s): "POCBNP" No results for input(s): "DDIMER" in the last 72 hours. No results for input(s): "HGBA1C" in the last 72 hours.  No results for input(s): "CHOL", "HDL",  "LDLCALC", "TRIG", "CHOLHDL", "LDLDIRECT" in the last 72 hours. No results for input(s): "TSH", "T4TOTAL", "T3FREE", "THYROIDAB" in the last 72 hours.  Invalid input(s): "FREET3" No results for input(s): "VITAMINB12", "FOLATE", "FERRITIN", "TIBC", "IRON", "RETICCTPCT" in the last 72 hours. Coags: No results for input(s): "  INR" in the last 72 hours.  Invalid input(s): "PT" Microbiology: No results found for this or any previous visit (from the past 240 hours).  FURTHER DISCHARGE INSTRUCTIONS:  Get Medicines reviewed and adjusted: Please take all your medications with you for your next visit with your Primary MD  Laboratory/radiological data: Please request your Primary MD to go over all hospital tests and procedure/radiological results at the follow up, please ask your Primary MD to get all Hospital records sent to his/her office.  In some cases, they will be blood work, cultures and biopsy results pending at the time of your discharge. Please request that your primary care M.D. goes through all the records of your hospital data and follows up on these results.  Also Note the following: If you experience worsening of your admission symptoms, develop shortness of breath, life threatening emergency, suicidal or homicidal thoughts you must seek medical attention immediately by calling 911 or calling your MD immediately  if symptoms less severe.  You must read complete instructions/literature along with all the possible adverse reactions/side effects for all the Medicines you take and that have been prescribed to you. Take any new Medicines after you have completely understood and accpet all the possible adverse reactions/side effects.   Do not drive when taking Pain medications or sleeping medications (Benzodaizepines)  Do not take more than prescribed Pain, Sleep and Anxiety Medications. It is not advisable to combine anxiety,sleep and pain medications without talking with your primary care  practitioner  Special Instructions: If you have smoked or chewed Tobacco  in the last 2 yrs please stop smoking, stop any regular Alcohol  and or any Recreational drug use.  Wear Seat belts while driving.  Please note: You were cared for by a hospitalist during your hospital stay. Once you are discharged, your primary care physician will handle any further medical issues. Please note that NO REFILLS for any discharge medications will be authorized once you are discharged, as it is imperative that you return to your primary care physician (or establish a relationship with a primary care physician if you do not have one) for your post hospital discharge needs so that they can reassess your need for medications and monitor your lab values.  Total Time spent coordinating discharge including counseling, education and face to face time equals greater than 30 minutes.  SignedJeoffrey Massed 11/17/2023 9:49 AM

## 2023-11-18 ENCOUNTER — Encounter (HOSPITAL_COMMUNITY): Payer: Self-pay | Admitting: Gastroenterology

## 2023-12-29 DIAGNOSIS — C61 Malignant neoplasm of prostate: Secondary | ICD-10-CM | POA: Diagnosis not present

## 2023-12-29 DIAGNOSIS — D49511 Neoplasm of unspecified behavior of right kidney: Secondary | ICD-10-CM | POA: Diagnosis not present

## 2024-01-05 DIAGNOSIS — D49511 Neoplasm of unspecified behavior of right kidney: Secondary | ICD-10-CM | POA: Diagnosis not present

## 2024-01-05 DIAGNOSIS — N184 Chronic kidney disease, stage 4 (severe): Secondary | ICD-10-CM | POA: Diagnosis not present

## 2024-01-05 DIAGNOSIS — D649 Anemia, unspecified: Secondary | ICD-10-CM | POA: Diagnosis not present

## 2024-01-05 DIAGNOSIS — C61 Malignant neoplasm of prostate: Secondary | ICD-10-CM | POA: Diagnosis not present

## 2024-01-05 DIAGNOSIS — E1136 Type 2 diabetes mellitus with diabetic cataract: Secondary | ICD-10-CM | POA: Diagnosis not present

## 2024-01-05 DIAGNOSIS — A048 Other specified bacterial intestinal infections: Secondary | ICD-10-CM | POA: Diagnosis not present

## 2024-01-17 DIAGNOSIS — D649 Anemia, unspecified: Secondary | ICD-10-CM | POA: Diagnosis not present

## 2024-01-17 DIAGNOSIS — Z8619 Personal history of other infectious and parasitic diseases: Secondary | ICD-10-CM | POA: Diagnosis not present

## 2024-01-30 DIAGNOSIS — D649 Anemia, unspecified: Secondary | ICD-10-CM | POA: Diagnosis not present

## 2024-02-29 DIAGNOSIS — N189 Chronic kidney disease, unspecified: Secondary | ICD-10-CM | POA: Diagnosis not present

## 2024-02-29 DIAGNOSIS — I129 Hypertensive chronic kidney disease with stage 1 through stage 4 chronic kidney disease, or unspecified chronic kidney disease: Secondary | ICD-10-CM | POA: Diagnosis not present

## 2024-02-29 DIAGNOSIS — M109 Gout, unspecified: Secondary | ICD-10-CM | POA: Diagnosis not present

## 2024-02-29 DIAGNOSIS — E872 Acidosis, unspecified: Secondary | ICD-10-CM | POA: Diagnosis not present

## 2024-02-29 DIAGNOSIS — N2889 Other specified disorders of kidney and ureter: Secondary | ICD-10-CM | POA: Diagnosis not present

## 2024-02-29 DIAGNOSIS — N184 Chronic kidney disease, stage 4 (severe): Secondary | ICD-10-CM | POA: Diagnosis not present

## 2024-02-29 DIAGNOSIS — N2581 Secondary hyperparathyroidism of renal origin: Secondary | ICD-10-CM | POA: Diagnosis not present

## 2024-02-29 DIAGNOSIS — E1122 Type 2 diabetes mellitus with diabetic chronic kidney disease: Secondary | ICD-10-CM | POA: Diagnosis not present

## 2024-02-29 DIAGNOSIS — D631 Anemia in chronic kidney disease: Secondary | ICD-10-CM | POA: Diagnosis not present

## 2024-02-29 DIAGNOSIS — E559 Vitamin D deficiency, unspecified: Secondary | ICD-10-CM | POA: Diagnosis not present

## 2024-03-08 DIAGNOSIS — C61 Malignant neoplasm of prostate: Secondary | ICD-10-CM | POA: Diagnosis not present

## 2024-03-15 DIAGNOSIS — D49511 Neoplasm of unspecified behavior of right kidney: Secondary | ICD-10-CM | POA: Diagnosis not present

## 2024-03-15 DIAGNOSIS — C61 Malignant neoplasm of prostate: Secondary | ICD-10-CM | POA: Diagnosis not present

## 2024-03-16 ENCOUNTER — Other Ambulatory Visit: Payer: Self-pay | Admitting: Urology

## 2024-03-16 DIAGNOSIS — D49511 Neoplasm of unspecified behavior of right kidney: Secondary | ICD-10-CM

## 2024-03-20 DIAGNOSIS — I10 Essential (primary) hypertension: Secondary | ICD-10-CM | POA: Diagnosis not present

## 2024-03-20 DIAGNOSIS — N184 Chronic kidney disease, stage 4 (severe): Secondary | ICD-10-CM | POA: Diagnosis not present

## 2024-03-20 DIAGNOSIS — M109 Gout, unspecified: Secondary | ICD-10-CM | POA: Diagnosis not present

## 2024-03-22 DIAGNOSIS — H401131 Primary open-angle glaucoma, bilateral, mild stage: Secondary | ICD-10-CM | POA: Diagnosis not present

## 2024-04-26 ENCOUNTER — Ambulatory Visit
Admission: RE | Admit: 2024-04-26 | Discharge: 2024-04-26 | Disposition: A | Discharge status: Home / Self Care | Source: Ambulatory Visit | Attending: Urology | Admitting: Urology

## 2024-04-26 DIAGNOSIS — N2889 Other specified disorders of kidney and ureter: Secondary | ICD-10-CM | POA: Diagnosis not present

## 2024-04-26 DIAGNOSIS — N281 Cyst of kidney, acquired: Secondary | ICD-10-CM | POA: Diagnosis not present

## 2024-04-26 DIAGNOSIS — D49511 Neoplasm of unspecified behavior of right kidney: Secondary | ICD-10-CM

## 2024-04-26 DIAGNOSIS — K5730 Diverticulosis of large intestine without perforation or abscess without bleeding: Secondary | ICD-10-CM | POA: Diagnosis not present

## 2024-04-26 MED ORDER — GADOPICLENOL 0.5 MMOL/ML IV SOLN
8.0000 mL | Freq: Once | INTRAVENOUS | Status: AC | PRN
Start: 2024-04-26 — End: 2024-04-26
  Administered 2024-04-26: 8 mL via INTRAVENOUS

## 2024-05-10 DIAGNOSIS — N2889 Other specified disorders of kidney and ureter: Secondary | ICD-10-CM | POA: Diagnosis not present

## 2024-05-10 DIAGNOSIS — D649 Anemia, unspecified: Secondary | ICD-10-CM | POA: Diagnosis not present

## 2024-05-10 DIAGNOSIS — Z1331 Encounter for screening for depression: Secondary | ICD-10-CM | POA: Diagnosis not present

## 2024-05-10 DIAGNOSIS — K5790 Diverticulosis of intestine, part unspecified, without perforation or abscess without bleeding: Secondary | ICD-10-CM | POA: Diagnosis not present

## 2024-05-10 DIAGNOSIS — E1159 Type 2 diabetes mellitus with other circulatory complications: Secondary | ICD-10-CM | POA: Diagnosis not present

## 2024-05-10 DIAGNOSIS — N184 Chronic kidney disease, stage 4 (severe): Secondary | ICD-10-CM | POA: Diagnosis not present

## 2024-05-10 DIAGNOSIS — E538 Deficiency of other specified B group vitamins: Secondary | ICD-10-CM | POA: Diagnosis not present

## 2024-05-10 DIAGNOSIS — Z6830 Body mass index (BMI) 30.0-30.9, adult: Secondary | ICD-10-CM | POA: Diagnosis not present

## 2024-05-10 DIAGNOSIS — M109 Gout, unspecified: Secondary | ICD-10-CM | POA: Diagnosis not present

## 2024-05-10 DIAGNOSIS — I10 Essential (primary) hypertension: Secondary | ICD-10-CM | POA: Diagnosis not present

## 2024-05-10 DIAGNOSIS — E559 Vitamin D deficiency, unspecified: Secondary | ICD-10-CM | POA: Diagnosis not present

## 2024-05-10 DIAGNOSIS — C61 Malignant neoplasm of prostate: Secondary | ICD-10-CM | POA: Diagnosis not present

## 2024-05-10 DIAGNOSIS — E1136 Type 2 diabetes mellitus with diabetic cataract: Secondary | ICD-10-CM | POA: Diagnosis not present

## 2024-05-10 DIAGNOSIS — Z0000 Encounter for general adult medical examination without abnormal findings: Secondary | ICD-10-CM | POA: Diagnosis not present

## 2024-06-07 DIAGNOSIS — E119 Type 2 diabetes mellitus without complications: Secondary | ICD-10-CM | POA: Diagnosis not present

## 2024-07-12 DIAGNOSIS — D49511 Neoplasm of unspecified behavior of right kidney: Secondary | ICD-10-CM | POA: Diagnosis not present

## 2024-07-19 DIAGNOSIS — D649 Anemia, unspecified: Secondary | ICD-10-CM | POA: Diagnosis not present

## 2024-07-19 DIAGNOSIS — Z8619 Personal history of other infectious and parasitic diseases: Secondary | ICD-10-CM | POA: Diagnosis not present

## 2024-09-04 DIAGNOSIS — I10 Essential (primary) hypertension: Secondary | ICD-10-CM | POA: Diagnosis not present

## 2024-09-04 DIAGNOSIS — N184 Chronic kidney disease, stage 4 (severe): Secondary | ICD-10-CM | POA: Diagnosis not present

## 2024-09-06 DIAGNOSIS — H401131 Primary open-angle glaucoma, bilateral, mild stage: Secondary | ICD-10-CM | POA: Diagnosis not present

## 2024-09-17 DIAGNOSIS — I129 Hypertensive chronic kidney disease with stage 1 through stage 4 chronic kidney disease, or unspecified chronic kidney disease: Secondary | ICD-10-CM | POA: Diagnosis not present

## 2024-09-17 DIAGNOSIS — N184 Chronic kidney disease, stage 4 (severe): Secondary | ICD-10-CM | POA: Diagnosis not present

## 2024-09-17 DIAGNOSIS — E559 Vitamin D deficiency, unspecified: Secondary | ICD-10-CM | POA: Diagnosis not present

## 2024-09-17 DIAGNOSIS — N2581 Secondary hyperparathyroidism of renal origin: Secondary | ICD-10-CM | POA: Diagnosis not present

## 2024-09-17 DIAGNOSIS — N2889 Other specified disorders of kidney and ureter: Secondary | ICD-10-CM | POA: Diagnosis not present

## 2024-09-17 DIAGNOSIS — D631 Anemia in chronic kidney disease: Secondary | ICD-10-CM | POA: Diagnosis not present
